# Patient Record
Sex: Male | Born: 1965 | Race: White | Hispanic: No | Marital: Married | Smoking: Never smoker
Health system: Southern US, Community
[De-identification: ages and names within clinical notes are randomized; demographics above are authoritative.]

## PROBLEM LIST (undated history)

## (undated) ENCOUNTER — Emergency Department: Payer: 59 | Source: Home / Self Care

## (undated) DIAGNOSIS — C8590 Non-Hodgkin lymphoma, unspecified, unspecified site: Secondary | ICD-10-CM

## (undated) HISTORY — DX: Non-Hodgkin lymphoma, unspecified, unspecified site: C85.90

---

## 1998-10-27 ENCOUNTER — Emergency Department (HOSPITAL_COMMUNITY): Admission: EM | Admit: 1998-10-27 | Discharge: 1998-10-27 | Payer: Self-pay

## 2008-04-16 ENCOUNTER — Inpatient Hospital Stay (HOSPITAL_COMMUNITY): Admission: EM | Admit: 2008-04-16 | Discharge: 2008-04-22 | Payer: Self-pay | Admitting: Emergency Medicine

## 2008-06-13 ENCOUNTER — Ambulatory Visit (HOSPITAL_COMMUNITY): Admission: RE | Admit: 2008-06-13 | Discharge: 2008-06-13 | Payer: Self-pay | Admitting: Family Medicine

## 2010-05-21 LAB — HEPATIC FUNCTION PANEL
ALT: 72 U/L — ABNORMAL HIGH (ref 0–53)
ALT: 77 U/L — ABNORMAL HIGH (ref 0–53)
AST: 119 U/L — ABNORMAL HIGH (ref 0–37)
AST: 121 U/L — ABNORMAL HIGH (ref 0–37)
Albumin: 2.8 g/dL — ABNORMAL LOW (ref 3.5–5.2)
Bilirubin, Direct: 0.1 mg/dL (ref 0.0–0.3)
Bilirubin, Direct: 0.2 mg/dL (ref 0.0–0.3)
Indirect Bilirubin: 0.3 mg/dL (ref 0.3–0.9)
Total Protein: 5.5 g/dL — ABNORMAL LOW (ref 6.0–8.3)

## 2010-05-21 LAB — CBC
HCT: 37.7 % — ABNORMAL LOW (ref 39.0–52.0)
HCT: 38.9 % — ABNORMAL LOW (ref 39.0–52.0)
HCT: 39.2 % (ref 39.0–52.0)
HCT: 42.6 % (ref 39.0–52.0)
Hemoglobin: 13.1 g/dL (ref 13.0–17.0)
Hemoglobin: 13.6 g/dL (ref 13.0–17.0)
MCHC: 34.8 g/dL (ref 30.0–36.0)
MCHC: 34.9 g/dL (ref 30.0–36.0)
MCHC: 35 g/dL (ref 30.0–36.0)
MCV: 89.3 fL (ref 78.0–100.0)
MCV: 89.4 fL (ref 78.0–100.0)
Platelets: 101 10*3/uL — ABNORMAL LOW (ref 150–400)
Platelets: 105 10*3/uL — ABNORMAL LOW (ref 150–400)
Platelets: 107 10*3/uL — ABNORMAL LOW (ref 150–400)
Platelets: 141 10*3/uL — ABNORMAL LOW (ref 150–400)
Platelets: 222 10*3/uL (ref 150–400)
Platelets: 90 10*3/uL — ABNORMAL LOW (ref 150–400)
RBC: 4.88 MIL/uL (ref 4.22–5.81)
RDW: 12.1 % (ref 11.5–15.5)
RDW: 12.3 % (ref 11.5–15.5)
RDW: 12.4 % (ref 11.5–15.5)
RDW: 12.7 % (ref 11.5–15.5)
WBC: 2.3 10*3/uL — ABNORMAL LOW (ref 4.0–10.5)
WBC: 2.6 10*3/uL — ABNORMAL LOW (ref 4.0–10.5)
WBC: 3.5 10*3/uL — ABNORMAL LOW (ref 4.0–10.5)
WBC: 7.1 10*3/uL (ref 4.0–10.5)

## 2010-05-21 LAB — LIPASE, BLOOD: Lipase: 74 U/L — ABNORMAL HIGH (ref 11–59)

## 2010-05-21 LAB — GLUCOSE, CAPILLARY
Glucose-Capillary: 102 mg/dL — ABNORMAL HIGH (ref 70–99)
Glucose-Capillary: 102 mg/dL — ABNORMAL HIGH (ref 70–99)
Glucose-Capillary: 105 mg/dL — ABNORMAL HIGH (ref 70–99)
Glucose-Capillary: 107 mg/dL — ABNORMAL HIGH (ref 70–99)
Glucose-Capillary: 114 mg/dL — ABNORMAL HIGH (ref 70–99)
Glucose-Capillary: 116 mg/dL — ABNORMAL HIGH (ref 70–99)
Glucose-Capillary: 83 mg/dL (ref 70–99)
Glucose-Capillary: 85 mg/dL (ref 70–99)
Glucose-Capillary: 89 mg/dL (ref 70–99)
Glucose-Capillary: 90 mg/dL (ref 70–99)
Glucose-Capillary: 96 mg/dL (ref 70–99)

## 2010-05-21 LAB — BASIC METABOLIC PANEL
BUN: 15 mg/dL (ref 6–23)
BUN: 6 mg/dL (ref 6–23)
BUN: 9 mg/dL (ref 6–23)
CO2: 30 mEq/L (ref 19–32)
Calcium: 7.7 mg/dL — ABNORMAL LOW (ref 8.4–10.5)
Calcium: 7.8 mg/dL — ABNORMAL LOW (ref 8.4–10.5)
Calcium: 7.8 mg/dL — ABNORMAL LOW (ref 8.4–10.5)
Calcium: 8.1 mg/dL — ABNORMAL LOW (ref 8.4–10.5)
Chloride: 102 mEq/L (ref 96–112)
Creatinine, Ser: 0.82 mg/dL (ref 0.4–1.5)
Creatinine, Ser: 0.85 mg/dL (ref 0.4–1.5)
Creatinine, Ser: 1 mg/dL (ref 0.4–1.5)
GFR calc Af Amer: >60 mL/min (ref >60)
GFR calc non Af Amer: >60 mL/min (ref >60)
GFR calc non Af Amer: >60 mL/min (ref >60)
GFR calc non Af Amer: >60 mL/min (ref >60)
GFR calc non Af Amer: >60 mL/min (ref >60)
GFR calc non Af Amer: >60 mL/min (ref >60)
Glucose, Bld: 105 mg/dL — ABNORMAL HIGH (ref 70–99)
Glucose, Bld: 91 mg/dL (ref 70–99)
Potassium: 3.6 mEq/L (ref 3.5–5.1)
Potassium: 4.3 mEq/L (ref 3.5–5.1)
Sodium: 127 mEq/L — ABNORMAL LOW (ref 135–145)
Sodium: 138 mEq/L (ref 135–145)

## 2010-05-21 LAB — DIFFERENTIAL
Basophils Absolute: 0 10*3/uL (ref 0.0–0.1)
Basophils Absolute: 0 10*3/uL (ref 0.0–0.1)
Basophils Absolute: 0 10*3/uL (ref 0.0–0.1)
Basophils Relative: 0 % (ref 0–1)
Basophils Relative: 0 % (ref 0–1)
Basophils Relative: 0 % (ref 0–1)
Eosinophils Absolute: 0 10*3/uL (ref 0.0–0.7)
Eosinophils Relative: 0 % (ref 0–5)
Eosinophils Relative: 0 % (ref 0–5)
Eosinophils Relative: 0 % (ref 0–5)
Lymphocytes Relative: 21 % (ref 12–46)
Lymphocytes Relative: 24 % (ref 12–46)
Lymphocytes Relative: 27 % (ref 12–46)
Lymphs Abs: 0.5 10*3/uL — ABNORMAL LOW (ref 0.7–4.0)
Lymphs Abs: 0.6 10*3/uL — ABNORMAL LOW (ref 0.7–4.0)
Lymphs Abs: 1 10*3/uL (ref 0.7–4.0)
Lymphs Abs: 1.1 10*3/uL (ref 0.7–4.0)
Lymphs Abs: 1.2 10*3/uL (ref 0.7–4.0)
Monocytes Absolute: 0.1 10*3/uL (ref 0.1–1.0)
Monocytes Absolute: 0.7 10*3/uL (ref 0.1–1.0)
Monocytes Absolute: 0.8 10*3/uL (ref 0.1–1.0)
Monocytes Relative: 2 % — ABNORMAL LOW (ref 3–12)
Monocytes Relative: 3 % (ref 3–12)
Monocytes Relative: 9 % (ref 3–12)
Neutro Abs: 1.6 10*3/uL — ABNORMAL LOW (ref 1.7–7.7)
Neutro Abs: 2 10*3/uL (ref 1.7–7.7)
Neutro Abs: 2.8 10*3/uL (ref 1.7–7.7)
Neutro Abs: 2.9 10*3/uL (ref 1.7–7.7)
Neutrophils Relative %: 57 % (ref 43–77)
Neutrophils Relative %: 70 % (ref 43–77)
Neutrophils Relative %: 71 % (ref 43–77)

## 2010-05-21 LAB — BLOOD GAS, ARTERIAL
Bicarbonate: 24.1 mEq/L — ABNORMAL HIGH (ref 20.0–24.0)
O2 Saturation: 93.1 %
Patient temperature: 37
TCO2: 21 mmol/L (ref 0–100)
pO2, Arterial: 66 mmHg — ABNORMAL LOW (ref 80.0–100.0)

## 2010-05-21 LAB — COMPREHENSIVE METABOLIC PANEL
Albumin: 2.4 g/dL — ABNORMAL LOW (ref 3.5–5.2)
Alkaline Phosphatase: 55 U/L (ref 39–117)
BUN: 13 mg/dL (ref 6–23)
Calcium: 7.6 mg/dL — ABNORMAL LOW (ref 8.4–10.5)
Creatinine, Ser: 1.19 mg/dL (ref 0.4–1.5)
Glucose, Bld: 115 mg/dL — ABNORMAL HIGH (ref 70–99)
Potassium: 3.9 mEq/L (ref 3.5–5.1)
Total Protein: 5.2 g/dL — ABNORMAL LOW (ref 6.0–8.3)

## 2010-05-21 LAB — PROTIME-INR
INR: 1.1 (ref 0.00–1.49)
Prothrombin Time: 14.3 seconds (ref 11.6–15.2)

## 2010-05-21 LAB — URINE MICROSCOPIC-ADD ON

## 2010-05-21 LAB — HEPATITIS PANEL, ACUTE: Hep A IgM: NEGATIVE

## 2010-05-21 LAB — URINALYSIS, ROUTINE W REFLEX MICROSCOPIC
Glucose, UA: NEGATIVE mg/dL
Leukocytes, UA: NEGATIVE
Nitrite: NEGATIVE
Specific Gravity, Urine: 1.025 (ref 1.005–1.030)
pH: 6 (ref 5.0–8.0)

## 2010-05-21 LAB — CARDIAC PANEL(CRET KIN+CKTOT+MB+TROPI)
CK, MB: 2.2 ng/mL (ref 0.3–4.0)
Total CK: 1192 U/L — ABNORMAL HIGH (ref 7–232)

## 2010-05-21 LAB — APTT: aPTT: 40 seconds — ABNORMAL HIGH (ref 24–37)

## 2010-05-21 LAB — HIV ANTIBODY (ROUTINE TESTING W REFLEX): HIV: NONREACTIVE

## 2010-05-21 LAB — LACTIC ACID, PLASMA: Lactic Acid, Venous: 1.4 mmol/L (ref 0.5–2.2)

## 2010-05-21 LAB — LEGIONELLA ANTIGEN, URINE: Legionella Antigen, Urine: NEGATIVE

## 2010-05-21 LAB — CK TOTAL AND CKMB (NOT AT ARMC)
CK, MB: 1.4 ng/mL (ref 0.3–4.0)
Relative Index: 0.1 (ref 0.0–2.5)

## 2010-05-21 LAB — HEMOGLOBIN A1C: Mean Plasma Glucose: 123 mg/dL

## 2010-05-21 LAB — CULTURE, BLOOD (ROUTINE X 2)
Culture: NO GROWTH
Report Status: 3142010

## 2010-05-21 LAB — STREP PNEUMONIAE URINARY ANTIGEN: Strep Pneumo Urinary Antigen: NEGATIVE

## 2010-06-23 NOTE — Group Therapy Note (Signed)
Kevin Good, Kevin Good                ACCOUNT NO.:  [id number]   MEDICAL RECORD NO.:  [id number]          PATIENT TYPE:  INP   LOCATION:  A306                          FACILITY:  [facility]   PHYSICIAN:  Dorris Singh, DO    DATE OF BIRTH:  [birth year]   DATE OF PROCEDURE:  04/20/2008  DATE OF DISCHARGE:                                 PROGRESS NOTE   The patient is seen today and states he feels a little bit better.  It  is a little easier to breathe but still having pain with inhalations.   PHYSICAL EXAMINATION:  VITAL SIGNS:  Temperature 98.5, pulse 104,  respirations 18, blood pressure 124/67.  GENERAL:  The patient is a 45 year old Caucasian male who is well-  developed, well-nourished, in no acute distress.  HEART:  Regular rate and rhythm.  LUNGS:  Positive crackles upper and bilaterally in both lung fields with  shallow respirations.  ABDOMEN:  Soft, nontender, nondistended.  EXTREMITIES:  Positive pulses.   Labs for today are as follows, white count of 4.9 which is an  improvement.  Hemoglobin 14.3, hematocrit 41.1, platelet count of 141.  Sodium 140, potassium 3.5, chloride 104, CO2 30, glucose 105, BUN 9,  creatinine 0.85.   ASSESSMENT AND PLAN:  1. Bilateral lung pneumonia, continue current antibiotic therapy.      Recommend incentive spirometry and O2.  2. Sepsis which has resolved.  3. Neutropenia.  This is actually resolving as well.  It is improving      from latest counts as well as his thrombocytopenia which is      improving.   We will continue to hydrate the patient and anticipate the possibility  of discharge home in the next 1-2 days as long as he improves.  I have  also recommended for the patient to be set up an appointment at  discharge with Dr. Mariel Sleet, the hematology/oncology doctor here.  He  has agreed.      Dorris Singh, DO  Electronically Signed     CB/MEDQ  D:  04/20/2008  T:  04/20/2008  Job:  [id number]

## 2010-06-23 NOTE — Discharge Summary (Signed)
[name], Good                ACCOUNT NO.:  [id number]   MEDICAL RECORD NO.:  [id number]          PATIENT TYPE:  INP   LOCATION:  A306                          FACILITY:  [facility]   PHYSICIAN:  Skeet Latch, DO    DATE OF BIRTH:  July 26, 1965   DATE OF ADMISSION:  04/16/2008  DATE OF DISCHARGE:  03/15/2010LH                               DISCHARGE SUMMARY   DISCHARGE DIAGNOSES:  1. Acute community-acquired pneumonia.  2. Dehydration.  3. History of diabetes.  4. History of hypertension.   BRIEF HOSPITAL COURSE:  This is a 45 year old Caucasian male who  presented with fever and productive cough.  The patient started to  develop chest pain with coughing and deep breathing which he states  sharp in nature located in retrosternal area of his chest.  The patient  experienced 103 temperature prior to being admitted and went to [facility]  ER.  He was diagnosed of pneumonia, sent home on erythromycin and cough  medication, did not feel better, went to the doctor's office and was  sent to emergency department at [facility].  Initial vitals showed  temperature of 99.3, blood pressure 118/75, heart rate on admission 120,  respiratory rate 24.  He is sating 91%.   Initial labs:  PH 7.433, PO2 is 66, PCO2 36.7, and bicarb 24.1.  Initial  chest x-ray showed bilateral lung infiltrates.  He did have CT angiogram  of his chest that showed no evidence of pulmonary embolism, showed  multifocal ground-glass and air space opacities that were most  consistent with infection, showed mild left hilar adenopathy with  increased number of small mediastinal lymph nodes.  Findings are most  likely reactive, did show a trace right-sided pleural effusion and fatty  infiltration of the liver.  The patient has been on IV antibiotics with  Levaquin and Zosyn.  The patient was also admitted and given round the  clock breathing treatments.  The patient has slightly improved, still  has some wheezing, but wants to  go home at this time.  I feel the  patient can go home if he has no fever and does not have elevated white  count.   DISCHARGE MEDICATIONS:  1. Levaquin 500 mg daily for 7 days.  2. Albuterol inhaler 2 puffs every 4-6 hours as needed.  3. Medrol Dosepak.   CONDITION ON DISCHARGE:  Stable.   DISPOSITION:  The patient will be discharged to home.   CURRENT VITALS:  Temperature is 97.1 and blood pressure 118/77.   LABORATORY DATA:  Sodium 139, potassium 3.3, chloride 105, CO2 is 27,  glucose 87, BUN 6, and creatinine 0.2.  White count 7.1, hemoglobin  13.0, hematocrit 37.5, platelet count 222,000.  Last chest x-ray showed  extensive right upper and lower lobe infiltrates compatible with  pneumonia.  No change from prior exam.   DISCHARGE PLANNING:  The patient is to maintain a low-fat, heart-healthy  diet.  He is to increase his activity slowly.  The patient is to take  all medications as prescribed.  We will give the patient contact  information regarding  Dr. Margo Aye; hopefully, he can follow up in the next  2-3 days.  The patient is to return to the emergency room if he has any  increasing shortness of breath or call 911 or his primary care  physician.  I believe the patient understands these instructions.      Skeet Latch, DO  Electronically Signed     SM/MEDQ  D:  04/22/2008  T:  04/23/2008  Job:  [id number]

## 2010-06-23 NOTE — H&P (Signed)
Kevin Good, Kevin Good                ACCOUNT NO.:  [id number]   MEDICAL RECORD NO.:  [id number]          PATIENT TYPE:  INP   LOCATION:  A306                          FACILITY:  [facility]   PHYSICIAN:  Osvaldo Shipper, MD     DATE OF BIRTH:  03-01-1965   DATE OF ADMISSION:  04/16/2008  DATE OF DISCHARGE:  LH                              HISTORY & PHYSICAL   PRIMARY CARE PHYSICIAN:  He does not have one.   ADMISSION DIAGNOSIS:  1. Acute community-acquired pneumonia.  2. Sepsis.  3. Dehydration.  4. Thrombocytopenia.  5. Leukopenia.   CHIEF COMPLAINT:  Cough, fever, chest pain since about 9 days.   HISTORY OF PRESENT ILLNESS:  The patient is a 45 year old Caucasian male  who was in his usual state of health until this past Tuesday, that is 9  days ago when he started feeling feverish, had a cough,  initially was  dry and then he started coughing up yellowish expectoration.  He started  developing chest pain especially with coughing and deep breathing which  was sharp in nature located in the retrosternal area.  And then he  experienced fever of 103 degrees Fahrenheit this past Sunday which  [facility]ed [facility].  He was diagnosed  with pneumonia and was sent home on erythromycin and a cough medication.  However, patient did not feel any better.  He became more and more short  of breath so he went to Dr. Tenna Delaine office today and he was referred  from there to the emergency department here at [facility].  He denies  any leg swelling.  Denies any sick contacts.  Denies any recent travel  anywhere.  No nausea, vomiting.  Denies ever having pneumonia in the  past.   MEDICATIONS AT HOME:  He is on erythromycin, cough syrup and Tylenol but  otherwise does not take any chronic medications.   ALLERGIES:  No known drug allergies.   PAST MEDICAL HISTORY:  Unremarkable for coronary artery disease,  diabetes, hypertension, pneumonia.  He has had an inguinal  hernia repair  in the past.   SOCIAL HISTORY:  Lives in [place] with his wife.  Quit smoking 15 years  ago.  No alcohol use.  No illicit drug use.  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  Denies any illicit relationships.   FAMILY HISTORY:  Mother is diabetic.   REVIEW OF SYSTEMS:  GENERAL:  Positive for weakness, malaise.  HEENT:  Unremarkable.  CARDIOVASCULAR:  Unremarkable.  RESPIRATORY:  As in HPI.  GI:  Unremarkable.  GU:  Unremarkable.  NEUROLOGIC:  Unremarkable.  Psychiatric unremarkable.  MUSCULOSKELETAL:  Unremarkable.  DERMATOLOGIC:  Unremarkable.  Other systems unremarkable.   PHYSICAL EXAMINATION:  VITAL SIGNS:  Temperature 99.3, blood pressure  118/65;  last reading is 140/80, heart rate was initially 120s regular,  respiratory rate 24, saturation 91%.  It is unclear if that was room air  or not but currently he is on 3 liters saturating about 93%, heart rate  has come down to  33s.  GENERAL:  An obese white male in no distress, sweating quite a bit.  HEENT: There is no pallor, no icterus.  Oral mucosal membranes are dry.  No oral lesions are noted.  NECK:  Soft and supple.  No thyromegaly is appreciated.  CARDIOVASCULAR:  S1 and S2 normal.  Regular.  No murmurs appreciated.  No S3 or S4.  No rubs.  No bruits.  LUNGS:  Reveal crackles bilateral bases.  Few rhonchi.  No wheezes are  present at this time.  ABDOMEN:  Obese, nontender, nondistended.  Bowel sounds are present.  No  masses or organomegaly is appreciated.  EXTREMITIES:  Show no edema.  Peripheral pulses are palpable.  MUSCULOSKELETAL:  Exam unremarkable.  NEUROLOGIC:  He is alert, oriented x3.  No focal neurological deficits  are present.  GU:  Examination was deferred.   Labs:  His ABG shows a pH of 7.43, pCO2 is  36, pO2 is 66.  This is on 3  liters and he is saturating 93% .  White count is 2.6 with  77%  neutrophils, hemoglobin 15.1, MCV 87, platelet count is 90,000.  Sodium  is  127, potassium is 3.6, chloride is 95, bicarb is 25,  glucose is 117,  BUN and creatinine are normal.  Calcium 7.7.  UA shows trace ketones,  large blood and some protein.   Chest x-ray shows bilateral lung infiltrates, looks like pretty much all  lobes are involved.   ASSESSMENT AND PLAN:  A 42-year white male who presents with cough,  fever, chest pain and has evidence for pneumonia and he has other acute  issues as well.  1. Acute community-acquired pneumonia.  Treat with Levaquin which will      cover atypicals as well.  Blood cultures will be sent.  I will also      check urine for Legionella and strep antigen.  Oxygen will be      provided.  He is hypoxic.  He is at high risk for respiratory      failure. He will be monitored closely on telemetry with frequent O2      saturation checks. Robitussin will be utilized as needed. Nebulizer      treatments will be utilized.  2. Abnormal urinalysis.  He could have rhabdomyolysis so we will go      ahead and check a total CK level.  3. Leukopenia and thrombocytopenia.  This could be a result of his      infection and sepsis.  Lactic acid level will be checked and these      levels will be followed very closely.  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  4. Dehydration with hyponatremia.  IV fluids will be provided.  LFTs      will be checked.  5. Deep venous thrombosis prophylaxis with sequential compressive      devices because of the thrombocytopenia.   Further  management decisions will depend on results of further testing  and patient's response to treatment.      Osvaldo Shipper, MD  Electronically Signed     GK/MEDQ  D:  04/16/2008  T:  04/16/2008  Job:  [id number]

## 2015-03-12 ENCOUNTER — Other Ambulatory Visit (HOSPITAL_COMMUNITY): Payer: Self-pay | Admitting: Family Medicine

## 2015-03-12 DIAGNOSIS — D489 Neoplasm of uncertain behavior, unspecified: Secondary | ICD-10-CM

## 2015-03-21 ENCOUNTER — Encounter (HOSPITAL_COMMUNITY)
Admission: RE | Admit: 2015-03-21 | Discharge: 2015-03-21 | Disposition: A | Discharge status: Home / Self Care | Payer: 59 | Source: Ambulatory Visit | Attending: Family Medicine | Admitting: Family Medicine

## 2015-03-21 ENCOUNTER — Other Ambulatory Visit (HOSPITAL_COMMUNITY): Payer: Self-pay | Admitting: Family Medicine

## 2015-03-21 DIAGNOSIS — D489 Neoplasm of uncertain behavior, unspecified: Secondary | ICD-10-CM | POA: Diagnosis present

## 2015-03-21 LAB — GLUCOSE, CAPILLARY: GLUCOSE-CAPILLARY: 126 mg/dL — AB (ref 65–99)

## 2015-03-21 MED ORDER — FLUDEOXYGLUCOSE F - 18 (FDG) INJECTION
11.6000 | Freq: Once | INTRAVENOUS | Status: AC | PRN
  Administered 2015-03-21: 11.6 via INTRAVENOUS

## 2015-07-24 DIAGNOSIS — C8258 Diffuse follicle center lymphoma, lymph nodes of multiple sites: Secondary | ICD-10-CM | POA: Insufficient documentation

## 2015-07-24 DIAGNOSIS — C8250 Diffuse follicle center lymphoma, unspecified site: Secondary | ICD-10-CM | POA: Insufficient documentation

## 2015-12-08 ENCOUNTER — Encounter (HOSPITAL_COMMUNITY): Payer: 59 | Attending: Oncology | Admitting: Oncology

## 2015-12-08 ENCOUNTER — Encounter (HOSPITAL_COMMUNITY): Payer: 59

## 2015-12-08 ENCOUNTER — Encounter (HOSPITAL_COMMUNITY): Payer: Self-pay | Admitting: Oncology

## 2015-12-08 VITALS — BP 155/105 | HR 71 | Temp 98.1°F | Resp 16 | Ht 70.0 in | Wt 221.0 lb

## 2015-12-08 DIAGNOSIS — Z0000 Encounter for general adult medical examination without abnormal findings: Secondary | ICD-10-CM

## 2015-12-08 DIAGNOSIS — C23 Malignant neoplasm of gallbladder: Secondary | ICD-10-CM | POA: Diagnosis not present

## 2015-12-08 DIAGNOSIS — C8293 Follicular lymphoma, unspecified, intra-abdominal lymph nodes: Secondary | ICD-10-CM | POA: Diagnosis present

## 2015-12-08 DIAGNOSIS — Z23 Encounter for immunization: Secondary | ICD-10-CM | POA: Diagnosis not present

## 2015-12-08 DIAGNOSIS — C8590 Non-Hodgkin lymphoma, unspecified, unspecified site: Secondary | ICD-10-CM | POA: Insufficient documentation

## 2015-12-08 HISTORY — DX: Non-Hodgkin lymphoma, unspecified, unspecified site: C85.90

## 2015-12-08 LAB — CBC WITH DIFFERENTIAL/PLATELET
BASOS PCT: 1 %
Basophils Absolute: 0 10*3/uL (ref 0.0–0.1)
Eosinophils Absolute: 0.3 10*3/uL (ref 0.0–0.7)
Eosinophils Relative: 3 %
HEMATOCRIT: 46.5 % (ref 39.0–52.0)
HEMOGLOBIN: 16.6 g/dL (ref 13.0–17.0)
LYMPHS ABS: 2.7 10*3/uL (ref 0.7–4.0)
Lymphocytes Relative: 32 %
MCH: 31.4 pg (ref 26.0–34.0)
MCHC: 35.7 g/dL (ref 30.0–36.0)
MCV: 88.1 fL (ref 78.0–100.0)
MONO ABS: 0.7 10*3/uL (ref 0.1–1.0)
Monocytes Relative: 8 %
NEUTROS ABS: 4.7 10*3/uL (ref 1.7–7.7)
NEUTROS PCT: 56 %
Platelets: 172 10*3/uL (ref 150–400)
RBC: 5.28 MIL/uL (ref 4.22–5.81)
RDW: 11.9 % (ref 11.5–15.5)
WBC: 8.4 10*3/uL (ref 4.0–10.5)

## 2015-12-08 LAB — COMPREHENSIVE METABOLIC PANEL
ALK PHOS: 122 U/L (ref 38–126)
ALT: 52 U/L (ref 17–63)
ANION GAP: 8 (ref 5–15)
AST: 26 U/L (ref 15–41)
Albumin: 4.5 g/dL (ref 3.5–5.0)
BILIRUBIN TOTAL: 0.5 mg/dL (ref 0.3–1.2)
BUN: 14 mg/dL (ref 6–20)
CALCIUM: 9 mg/dL (ref 8.9–10.3)
CO2: 27 mmol/L (ref 22–32)
Chloride: 100 mmol/L — ABNORMAL LOW (ref 101–111)
Creatinine, Ser: 0.91 mg/dL (ref 0.61–1.24)
Glucose, Bld: 329 mg/dL — ABNORMAL HIGH (ref 65–99)
POTASSIUM: 3.6 mmol/L (ref 3.5–5.1)
Sodium: 135 mmol/L (ref 135–145)
TOTAL PROTEIN: 7.3 g/dL (ref 6.5–8.1)

## 2015-12-08 LAB — SEDIMENTATION RATE: SED RATE: 2 mm/h (ref 0–16)

## 2015-12-08 LAB — C-REACTIVE PROTEIN: CRP: <0.8 mg/dL (ref <1.0)

## 2015-12-08 LAB — LACTATE DEHYDROGENASE: LDH: 134 U/L (ref 98–192)

## 2015-12-08 MED ORDER — PNEUMOCOCCAL 13-VAL CONJ VACC IM SUSP
0.5000 mL | Freq: Once | INTRAMUSCULAR | Status: AC
  Administered 2015-12-08: 0.5 mL via INTRAMUSCULAR

## 2015-12-08 MED ORDER — INFLUENZA VAC SPLIT QUAD 0.5 ML IM SUSY
PREFILLED_SYRINGE | INTRAMUSCULAR | Status: AC
  Filled 2015-12-08: qty 0.5

## 2015-12-08 MED ORDER — PNEUMOCOCCAL 13-VAL CONJ VACC IM SUSP
INTRAMUSCULAR | Status: AC
  Filled 2015-12-08: qty 0.5

## 2015-12-08 MED ORDER — INFLUENZA VAC SPLIT QUAD 0.5 ML IM SUSY
0.5000 mL | PREFILLED_SYRINGE | Freq: Once | INTRAMUSCULAR | Status: AC
  Administered 2015-12-08: 0.5 mL via INTRAMUSCULAR

## 2015-12-08 NOTE — Progress Notes (Signed)
Pt given Flu shot and Prevnar injection today. Pt tolerated well. Pt stable and discharged home with wife.

## 2015-12-08 NOTE — Assessment & Plan Note (Signed)
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  S/P mesenteric soft tissue biopsy by Dr. Cathey on 03/31/2015 and bone marrow aspiration and biopsy on 06/23/2015 (both performed at [facility]).  Oncology history developed.  Labs today: CBC diff, CMET, LDH, ESR, CRP.  I personally reviewed and went over laboratory results with the patient.  The results are noted within this dictation.  I personally reviewed and went over radiographic studies with the patient.  The results are noted within this dictation.  CT imaging from June 2017 reviewed.  I have called Rhonda in [place] pathology to request a second read on both biopsy of mesentery mass in addition to bone marrow aspiration and biopsy.  She is provided all the needed information.  Repeat imaging in December 2017 ordered: CT CAP with contrast.  Influenza immunization provided today.  Prevnar 13 immunization given today.  He can get PPSV23 in >/= 8 weeks from today.  PPSV23 order is signed and held.  Return in December 2017 to review imaging and set-up ongoing surveillance program. 

## 2015-12-08 NOTE — Progress Notes (Signed)
Review of [facility] Hematology/Oncology Consultation   Name: ANOOP HEMMER      MRN: [id number]     Date: 01/25/2016 Time:5:42 PM   REFERRING PHYSICIAN:  Tawni Carnes, PA-C (Primary care provider).  REASON FOR CONSULT:  NHL   DIAGNOSIS:  NHL, B-cell- Follicular lymphoma.    Non-Hodgkin's lymphoma ([place])   03/03/2015 Imaging    CT abd/pelvis- tiny periumbilical abdominal hernia containing fat, 5 cm irregular soft tissue mass in central small bowel mesentery, with mild adjacent lymphadenopathy.       03/21/2015 PET scan    1. Hypermetabolic central mesenteric mass. Hypermetabolic bilateral neck, bilateral axillary and bilateral pelvic lymphadenopathy. Findings are most suggestive of lymphoma. Tissue sampling is recommended. The most accessible pathologic targets are probably the axillary or inguinal nodes. 2. Additional findings include diffuse hepatic steatosis and small fat containing supraumbilical hernia.      04/01/2015 Procedure    Laparoscopic hernia repair with incidentally found mesenteric mass that was biopsied- Dr. Ladona Horns      04/01/2015 Pathology Results    NHL, B-cell type, follicular cell.      04/09/2015 Miscellaneous    Consultation with Dr. Jacquiline Doe at [facility]      06/23/2015 Bone Marrow Biopsy    Bone marrow aspiration and biopsy      07/21/2015 Imaging    CT CAP at [facility]- minimal decrease in size of abdominal mesenteric nodal mass and pelvic sidewall adenopathy.  Similar sized axillary nodes corresponding to hypermetabolism on prior PET.  No new sites of disease identified.  L5 pars defects.       HISTORY OF PRESENT ILLNESS:   Kevin Good is a 50 y.o. male with a medical history significant for DM, HTN, and seasonal allergies who is referred to the [facility] for transfer of hematology care for [place].  He reported abdominal pain earlier this year.  His abdominal pain was  thought to be secondary to abdominal hernia.  CT imaging was performed to evaluate this pain and a 5 cm mesenteric mass was noted, along with adenopathy.  He was otherwise asymptomatic.  He did undergo hernia repair by Dr. Ladona Horns in Feb 2017 with a biopsy of mesenteric mass.  Pathology demonstrated NHL.  He did undergo bone marrow aspiration and biopsy in May 2017.  Due to low disease burden and improvement in his abdominal pain after his hernia repair no additional therapy was pursued. He is asymptomatic. He notes that he feels well. He saw Dr. Jacquiline Doe in consultation at [facility], last visit was in June.  He denies any B symptoms.  His weight is stable.  His appetite is strong.  He denies any new lumps/bumps.  He denies. He continues to work full time, 5 days per week.  He denies any severe fatigue.    Review of Systems  Constitutional: Negative for chills, fever and weight loss.  HENT: Negative.   Eyes: Negative.   Respiratory: Negative.  Negative for cough and shortness of breath.   Cardiovascular: Negative.  Negative for chest pain.  Gastrointestinal: Negative.  Negative for abdominal pain, constipation, diarrhea, nausea and vomiting.  Genitourinary: Negative.   Musculoskeletal: Negative.   Skin: Negative.  Negative for rash.  Neurological: Negative.  Negative for weakness.  Endo/Heme/Allergies: Negative.   Psychiatric/Behavioral: Negative.   14 point review of systems was performed and is negative except as detailed under history of present  illness and above   PAST MEDICAL HISTORY:   Past Medical History:  Diagnosis Date  . Non-Hodgkin lymphoma ([place]) 12/08/2015    ALLERGIES: Not on File    MEDICATIONS: I have reviewed the patient's current medications.    No current outpatient prescriptions on file prior to visit.   No current facility-administered medications on file prior to visit.      PAST SURGICAL HISTORY History reviewed. No pertinent surgical history.  Inguinal  hernia repair Left 1194  Umbilical hernia repair 17/40/8144   FAMILY HISTORY: Mother is alive at the age of 35 with poorly controlled DM Father is alive at the age of 55 with heart disease 1 son age 17 who is healthy.  SOCIAL HISTORY:  reports that he has never smoked. He has never used smokeless tobacco. He reports that he does not drink alcohol or use drugs.  He is [place] in religion.  Married x 20 years.  He works for [employer] as a Administrator, Museum/gallery exhibitions officer, delivering fuel to stations.  Social History   Social History  . Marital status: Married    Spouse name: N/A  . Number of children: N/A  . Years of education: N/A   Social History Main Topics  . Smoking status: Never Smoker  . Smokeless tobacco: Never Used  . Alcohol use No  . Drug use: No  . Sexual activity: Not Asked   Other Topics Concern  . None   Social History Narrative  . None    PERFORMANCE STATUS: The patient's performance status is 0 - Asymptomatic  PHYSICAL EXAM: Most Recent Vital Signs: Blood pressure (!) 155/105, pulse 71, temperature 98.1 F (36.7 C), temperature source Oral, resp. rate 16, height '5\' 10"'  (1.778 m), weight 221 lb (100.2 kg), SpO2 96 %. General appearance: alert, cooperative, appears stated age, no distress, mildly obese and accompanied by wife "Missy". Head: Normocephalic, without obvious abnormality, atraumatic Eyes: negative findings: lids and lashes normal, conjunctivae and sclerae normal and corneas clear Throat: lips, mucosa, and tongue normal; teeth and gums normal Neck: no adenopathy and supple, symmetrical, trachea midline Back: symmetric, no curvature. ROM normal. No CVA tenderness. Lungs: clear to auscultation bilaterally and normal percussion bilaterally Heart: regular rate and rhythm, S1, S2 normal, no murmur, click, rub or gallop Abdomen: soft, non-tender; bowel sounds normal; no masses,  no organomegaly Extremities: extremities normal, atraumatic, no cyanosis or  edema Skin: Skin color, texture, turgor normal. No rashes or lesions Lymph nodes: Inguinal adenopathy: shotty bilateral inguinal nodes on exam Neurologic: Grossly normal  LABORATORY DATA:  CBC    Component Value Date/Time   WBC 8.4 12/08/2015 1333   RBC 5.28 12/08/2015 1333   HGB 16.6 12/08/2015 1333   HCT 46.5 12/08/2015 1333   PLT 172 12/08/2015 1333   MCV 88.1 12/08/2015 1333   MCH 31.4 12/08/2015 1333   MCHC 35.7 12/08/2015 1333   RDW 11.9 12/08/2015 1333   LYMPHSABS 2.7 12/08/2015 1333   MONOABS 0.7 12/08/2015 1333   EOSABS 0.3 12/08/2015 1333   BASOSABS 0.0 12/08/2015 1333     Chemistry      Component Value Date/Time   NA 135 12/08/2015 1333   K 3.6 12/08/2015 1333   CL 100 (L) 12/08/2015 1333   CO2 27 12/08/2015 1333   BUN 14 12/08/2015 1333   CREATININE 0.91 12/08/2015 1333      Component Value Date/Time   CALCIUM 9.0 12/08/2015 1333   ALKPHOS 122 12/08/2015 1333   AST 26 12/08/2015 1333  ALT 52 12/08/2015 1333   BILITOT 0.5 12/08/2015 1333     Lab Results  Component Value Date   ESRSEDRATE 2 12/08/2015     RADIOGRAPHY: No results found.     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  S/P mesenteric soft tissue biopsy by Dr. Ladona Horns on 03/31/2015 and bone marrow aspiration and biopsy on 06/23/2015 (both performed at [facility]).  Oncology history developed.  Labs today: CBC diff, CMET, LDH, ESR, CRP.  I personally reviewed and went over laboratory results with the patient.  The results are noted within this dictation.  I personally reviewed and went over radiographic studies with the patient.  The results are noted within this dictation.  CT imaging from June 2017 reviewed.  I have called Rhonda in [place] pathology to request a second read on both biopsy of mesentery mass in addition to bone marrow aspiration and biopsy.  She is provided all  the needed information. Will request copies of his BMBX for our records.   Repeat imaging in December 2017 ordered: CT CAP with contrast. If lymphoma is low grade and patient asymptomatic will review NCCN guidelines at follow-up with recommendations for ongoing surveillance with the patient in detail.   Influenza immunization provided today.  Prevnar 13 immunization given today.  He can get PPSV23 in >/= 8 weeks from today.  PPSV23 order is signed and held.  Return in December 2017 to review imaging and set-up ongoing surveillance program.  ORDERS PLACED FOR THIS ENCOUNTER: Orders Placed This Encounter  Procedures  . CT Abdomen Pelvis W Contrast  . CT Chest W Contrast  . CBC with Differential  . Comprehensive metabolic panel  . Lactate dehydrogenase  . Sedimentation rate  . C-reactive protein    MEDICATIONS PRESCRIBED THIS ENCOUNTER: Meds ordered this encounter  Medications  . lisinopril-hydrochlorothiazide (PRINZIDE,ZESTORETIC) 20-12.5 MG tablet  . metFORMIN (GLUCOPHAGE-XR) 500 MG 24 hr tablet  . pneumococcal 13-valent conjugate vaccine (PREVNAR 13) injection 0.5 mL  . Influenza vac split quadrivalent PF (FLUARIX) injection 0.5 mL    All questions were answered. The patient knows to call the clinic with any problems, questions or concerns. We can certainly see the patient much sooner if necessary.  This note is electronically signed by: Molli Hazard, MD 01/25/2016 5:42 PM

## 2015-12-08 NOTE — Patient Instructions (Signed)
[place] at [facility] Discharge Instructions  RECOMMENDATIONS MADE BY THE CONSULTANT AND ANY TEST RESULTS WILL BE SENT TO YOUR REFERRING PHYSICIAN.  You saw Kirby Crigler, PA-C, today. You had the flu Vaccine and Pneumovax today. You will need a 2nd pneumonia shot in 2 months. Lab work today. CT scan in December. Follow up in December after scans.  Thank you for choosing [place] at [facility] to provide your oncology and hematology care.  To afford each patient quality time with our provider, please arrive at least 15 minutes before your scheduled appointment time.   Beginning January 23rd 2017 lab work for the [facility] will be done in the  Main lab at [facility] on 1st floor. If you have a lab appointment with the [facility] please come in thru the  Main Entrance and check in at the main information desk  You need to re-schedule your appointment should you arrive 10 or more minutes late.  We strive to give you quality time with our providers, and arriving late affects you and other patients whose appointments are after yours.  Also, if you no show three or more times for appointments you may be dismissed from the clinic at the providers discretion.     Again, thank you for choosing [facility].  Our hope is that these requests will decrease the amount of time that you wait before being seen by our physicians.       _____________________________________________________________  Should you have questions after your visit to [facility], please contact our office at [phone] between the hours of 8:30 a.m. and 4:30 p.m.  Voicemails left after 4:30 p.m. will not be returned until the following business day.  For prescription refill requests, have your pharmacy contact our office.         Resources For Cancer Patients and their Caregivers ? American Cancer Society: Can assist with transportation,  wigs, general needs, runs Look Good Feel Better.        ([phone] ? Cancer Care: Provides financial assistance, online support groups, medication/co-pay assistance.  1-800-813-HOPE 3303079838) ? [place] Assists [place] cancer patients and their families through emotional , educational and financial support.  ([phone] [phone] ? [place] DSS Where to apply for food stamps, Medicaid and utility assistance. (515)152-7247 ? RCATS: Transportation to medical appointments. 210-525-8404 ? Social Security Administration: May apply for disability if have a Stage IV cancer. 951-468-3903 321-490-7032 ? [employer], Disability and Transit Services: Assists with nutrition, care and transit needs. [place] Support Programs: @10RELATIVEDAYS @ > Cancer Support Group  2nd Tuesday of the month 1pm-2pm, Journey Room  > Creative Journey  3rd Tuesday of the month 1130am-1pm, Journey Room  > Look Good Feel Better  1st Wednesday of the month 10am-12 noon, Journey Room (Call [place] to register [phone])

## 2015-12-11 ENCOUNTER — Telehealth (HOSPITAL_COMMUNITY): Payer: Self-pay | Admitting: *Deleted

## 2015-12-17 ENCOUNTER — Other Ambulatory Visit (HOSPITAL_COMMUNITY): Payer: Self-pay | Admitting: Oncology

## 2016-01-25 ENCOUNTER — Encounter (HOSPITAL_COMMUNITY): Payer: Self-pay | Admitting: Oncology

## 2016-01-26 ENCOUNTER — Ambulatory Visit (HOSPITAL_COMMUNITY)
Admission: RE | Admit: 2016-01-26 | Discharge: 2016-01-26 | Disposition: A | Discharge status: Home / Self Care | Payer: 59 | Source: Ambulatory Visit | Attending: Oncology | Admitting: Oncology

## 2016-01-26 DIAGNOSIS — C8293 Follicular lymphoma, unspecified, intra-abdominal lymph nodes: Secondary | ICD-10-CM | POA: Diagnosis not present

## 2016-01-26 DIAGNOSIS — I700 Atherosclerosis of aorta: Secondary | ICD-10-CM | POA: Insufficient documentation

## 2016-01-26 DIAGNOSIS — K760 Fatty (change of) liver, not elsewhere classified: Secondary | ICD-10-CM | POA: Diagnosis not present

## 2016-01-26 DIAGNOSIS — K439 Ventral hernia without obstruction or gangrene: Secondary | ICD-10-CM | POA: Diagnosis not present

## 2016-01-26 DIAGNOSIS — R160 Hepatomegaly, not elsewhere classified: Secondary | ICD-10-CM | POA: Insufficient documentation

## 2016-01-26 DIAGNOSIS — R590 Localized enlarged lymph nodes: Secondary | ICD-10-CM | POA: Insufficient documentation

## 2016-01-26 LAB — POCT I-STAT CREATININE: CREATININE: 0.9 mg/dL (ref 0.61–1.24)

## 2016-01-26 MED ORDER — IOPAMIDOL (ISOVUE-300) INJECTION 61%
100.0000 mL | Freq: Once | INTRAVENOUS | Status: AC | PRN
  Administered 2016-01-26: 100 mL via INTRAVENOUS

## 2016-01-28 ENCOUNTER — Ambulatory Visit (HOSPITAL_COMMUNITY): Payer: 59 | Admitting: Hematology & Oncology

## 2016-02-05 ENCOUNTER — Ambulatory Visit (HOSPITAL_COMMUNITY): Payer: 59

## 2016-02-16 ENCOUNTER — Encounter (HOSPITAL_BASED_OUTPATIENT_CLINIC_OR_DEPARTMENT_OTHER): Payer: 59

## 2016-02-16 ENCOUNTER — Encounter (HOSPITAL_COMMUNITY): Payer: Self-pay | Admitting: Hematology & Oncology

## 2016-02-16 ENCOUNTER — Encounter (HOSPITAL_COMMUNITY): Payer: 59 | Attending: Oncology | Admitting: Hematology & Oncology

## 2016-02-16 VITALS — BP 140/96 | HR 64 | Temp 97.7°F | Resp 20 | Wt 220.4 lb

## 2016-02-16 DIAGNOSIS — C8293 Follicular lymphoma, unspecified, intra-abdominal lymph nodes: Secondary | ICD-10-CM | POA: Diagnosis not present

## 2016-02-16 DIAGNOSIS — Z23 Encounter for immunization: Secondary | ICD-10-CM | POA: Diagnosis not present

## 2016-02-16 DIAGNOSIS — K760 Fatty (change of) liver, not elsewhere classified: Secondary | ICD-10-CM | POA: Diagnosis not present

## 2016-02-16 DIAGNOSIS — C8258 Diffuse follicle center lymphoma, lymph nodes of multiple sites: Secondary | ICD-10-CM

## 2016-02-16 DIAGNOSIS — Z0000 Encounter for general adult medical examination without abnormal findings: Secondary | ICD-10-CM

## 2016-02-16 MED ORDER — PNEUMOCOCCAL VAC POLYVALENT 25 MCG/0.5ML IJ INJ
0.5000 mL | INJECTION | Freq: Once | INTRAMUSCULAR | Status: AC
  Administered 2016-02-16: 0.5 mL via INTRAMUSCULAR
  Filled 2016-02-16: qty 0.5

## 2016-02-16 NOTE — Progress Notes (Signed)
Review of Systems  Constitutional: Positive for malaise/fatigue.  HENT: Negative.   Eyes: Negative.   Respiratory: Negative.   Cardiovascular: Negative.   Gastrointestinal: Negative.  Negative for abdominal pain.  Genitourinary: Negative.   Musculoskeletal: Negative.   Skin: Negative.   Neurological: Negative.   Endo/Heme/Allergies: Negative.   Psychiatric/Behavioral: Negative.   All other systems reviewed and are negative.          [facility] Hematology/Oncology Progress Note  Name: Kevin Good      MRN: [id number]     Date: 02/22/2016 Time:4:49 PM   REFERRING PHYSICIAN:  Tawni Carnes, PA-C (Primary care provider).  REASON FOR CONSULT:  NHL   DIAGNOSIS:  NHL, B-cell- Follicular lymphoma.    Non-Hodgkin's lymphoma ([place])   03/03/2015 Imaging    CT abd/pelvis- tiny periumbilical abdominal hernia containing fat, 5 cm irregular soft tissue mass in central small bowel mesentery, with mild adjacent lymphadenopathy.       03/21/2015 PET scan    1. Hypermetabolic central mesenteric mass. Hypermetabolic bilateral neck, bilateral axillary and bilateral pelvic lymphadenopathy. Findings are most suggestive of lymphoma. Tissue sampling is recommended. The most accessible pathologic targets are probably the axillary or inguinal nodes. 2. Additional findings include diffuse hepatic steatosis and small fat containing supraumbilical hernia.      04/01/2015 Procedure    Laparoscopic hernia repair with incidentally found mesenteric mass that was biopsied- Dr. Ladona Horns      04/01/2015 Pathology Results    NHL, B-cell type, follicular cell.      04/09/2015 Miscellaneous    Consultation with Dr. Jacquiline Doe at [facility]      06/23/2015 Bone Marrow Biopsy    Bone marrow aspiration and biopsy      07/21/2015 Imaging    CT CAP at [facility]- minimal decrease in size of abdominal mesenteric nodal mass and pelvic sidewall adenopathy.  Similar sized axillary nodes corresponding  to hypermetabolism on prior PET.  No new sites of disease identified.  L5 pars defects.      01/26/2016 Imaging    CT CAP- 1. Similar to slight response to therapy of abdominopelvic adenopathy. Although a dominant mesenteric mass measures smaller in transverse dimension today, is larger in greatest craniocaudal dimension. This suggests stability with measurement differences secondary to obliquity. A left external iliac node is slightly decreased in size while a right external iliac node is felt to be similar. 2. No new sites of disease identified. 3. Hepatic steatosis and hepatomegaly. 4. Aortic atherosclerosis. 5. Increased size of a fat containing ventral abdominal wall hernia. No residual edema to suggest strangulation.       HISTORY OF PRESENT ILLNESS:   Kevin Good is a 51 y.o. male with a medical history significant for DM, HTN, and seasonal allergies who is referred to the [facility] for transfer of hematology care for [place]. He has never undergone treatment for this.   He reported abdominal pain in early 2017.  His abdominal pain was thought to be secondary to abdominal hernia.  CT imaging was performed to evaluate this pain and a 5 cm mesenteric mass was noted, along with adenopathy.  He was otherwise asymptomatic.  He did undergo hernia repair by Dr. Ladona Horns in Feb 2017 with a biopsy of mesenteric mass.  Pathology demonstrated NHL.  He did undergo bone marrow aspiration and biopsy in May 2017.  He presents to the cancer center today unaccompanied. I have reviewed the scans with the patient.  He has never seen GI for his fatty liver, noted on imaging. 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 He notes that he has been making an active effort to improve his health.   His BP medication makes him drowsy occasionally. He takes it daily  Denies abdominal pain or any other complaints today.  No B symptoms. He notes that he feels well   Review of Systems  Constitutional: Positive for malaise/fatigue.  HENT: Negative.   Eyes: Negative.   Respiratory: Negative.   Cardiovascular: Negative.   Gastrointestinal: Negative.  Negative for abdominal pain.  Genitourinary: Negative.   Musculoskeletal: Negative.   Skin: Negative.   Neurological: Negative.   Endo/Heme/Allergies: Negative.   Psychiatric/Behavioral: Negative.   All other systems reviewed and are negative.  14 point review of systems was performed and is negative except as detailed under history of present illness and above   PAST MEDICAL HISTORY:   Past Medical History:  Diagnosis Date  . Non-Hodgkin lymphoma ([place]) 12/08/2015    ALLERGIES: Not on File    MEDICATIONS: I have reviewed the patient's current medications.    Current Outpatient Prescriptions on File Prior to Visit  Medication Sig Dispense Refill  . lisinopril-hydrochlorothiazide (PRINZIDE,ZESTORETIC) 20-12.5 MG tablet     . metFORMIN (GLUCOPHAGE-XR) 500 MG 24 hr tablet      No current facility-administered medications on file prior to visit.      PAST SURGICAL HISTORY History reviewed. No pertinent surgical history.  Inguinal hernia repair Left 5465  Umbilical hernia repair 03/54/6568   FAMILY HISTORY: Mother is alive at the age of 74 with poorly controlled DM Father is alive at the age of 63 with heart disease 1 son age 48 who is healthy.  SOCIAL HISTORY:  reports that he has never smoked. He has never used smokeless tobacco. He reports that he does not drink alcohol or use drugs.  He is [place] in religion.  Married x 20 years.  He works for [employer] as a Administrator, Museum/gallery exhibitions officer, delivering fuel to stations.  Social History   Social History  . Marital status: Married    Spouse name: N/A  . Number of children: N/A  . Years of education: N/A   Social History Main Topics  . Smoking status: Never Smoker  . Smokeless tobacco: Never  Used  . Alcohol use No  . Drug use: No  . Sexual activity: Not Asked   Other Topics Concern  . None   Social History Narrative  . None    PERFORMANCE STATUS: The patient's performance status is 0 - Asymptomatic  PHYSICAL EXAM: Most Recent Vital Signs: Blood pressure (!) 140/96, pulse 64, temperature 97.7 F (36.5 C), temperature source Oral, resp. rate 20, weight 220 lb 6.4 oz (100 kg), SpO2 97 %.   Physical Exam  Constitutional: He is oriented to person, place, and time and well-developed, well-nourished, and in no distress.  Pt was able to get on exam table without assistance.   HENT:  Head: Normocephalic and atraumatic.  Mouth/Throat: Oropharynx is clear and moist.  Eyes: Conjunctivae and EOM are normal. Pupils are equal, round, and reactive to light. No scleral icterus.  Neck: Normal range of motion. Neck supple.  Cardiovascular: Normal rate, regular rhythm and normal heart sounds.   Pulmonary/Chest: Effort normal and breath sounds normal.  Abdominal: Soft. Bowel sounds are normal. He exhibits no distension and no mass. There is no tenderness.  There is no rebound and no guarding.  Musculoskeletal: Normal range of motion.  Lymphadenopathy:    He has no cervical adenopathy.  Neurological: He is alert and oriented to person, place, and time. Gait normal.  Skin: Skin is warm and dry.  Psychiatric: Mood, memory, affect and judgment normal.  Nursing note and vitals reviewed.   LABORATORY DATA:  CBC    Component Value Date/Time   WBC 8.4 12/08/2015 1333   RBC 5.28 12/08/2015 1333   HGB 16.6 12/08/2015 1333   HCT 46.5 12/08/2015 1333   PLT 172 12/08/2015 1333   MCV 88.1 12/08/2015 1333   MCH 31.4 12/08/2015 1333   MCHC 35.7 12/08/2015 1333   RDW 11.9 12/08/2015 1333   LYMPHSABS 2.7 12/08/2015 1333   MONOABS 0.7 12/08/2015 1333   EOSABS 0.3 12/08/2015 1333   BASOSABS 0.0 12/08/2015 1333     Chemistry      Component Value Date/Time   NA 135 12/08/2015 1333   K  3.6 12/08/2015 1333   CL 100 (L) 12/08/2015 1333   CO2 27 12/08/2015 1333   BUN 14 12/08/2015 1333   CREATININE 0.90 01/26/2016 1035      Component Value Date/Time   CALCIUM 9.0 12/08/2015 1333   ALKPHOS 122 12/08/2015 1333   AST 26 12/08/2015 1333   ALT 52 12/08/2015 1333   BILITOT 0.5 12/08/2015 1333     Lab Results  Component Value Date   ESRSEDRATE 2 12/08/2015     RADIOGRAPHY: I have reviewed the below radiographic studies and agree with their findings.   Study Result   CLINICAL DATA:  Restaging of follicular lymphoma diagnosed 18 months ago. Patient denies treatments. Diabetes. Hernia surgery.  EXAM: CT CHEST, ABDOMEN, AND PELVIS WITH CONTRAST  TECHNIQUE: Multidetector CT imaging of the chest, abdomen and pelvis was performed following the standard protocol during bolus administration of intravenous contrast.  CONTRAST:  165m ISOVUE-300 IOPAMIDOL (ISOVUE-300) INJECTION 61%  COMPARISON:  07/21/2015 PET of 03/21/2015.  FINDINGS: CT CHEST FINDINGS  Cardiovascular: Ascending aortic dilatation. This measures on the order of 4.2 cm on image 27/series 2. Similar to on the prior exam (when remeasured). Tortuous thoracic aorta. Borderline cardiomegaly. No pericardial effusion. No central pulmonary embolism, on this non-dedicated study.  Mediastinum/Nodes: No supraclavicular adenopathy. Index right axillary node measures 1.6 x 0.8 cm on image 19/series 2. This is unchanged. No axillary adenopathy. No mediastinal or hilar adenopathy.  Lungs/Pleura: No pleural fluid.  Clear lungs.  Musculoskeletal: No acute osseous abnormality.  CT ABDOMEN PELVIS FINDINGS  Hepatobiliary: Suspicion of mild hepatic steatosis. Sparing adjacent the gallbladder. No suspicious liver lesion. Hepatomegaly at 19.1 cm craniocaudal. Normal gallbladder, without biliary ductal dilatation.  Pancreas: Normal, without mass or ductal dilatation.  Spleen: Normal in size,  without focal abnormality.  Adrenals/Urinary Tract: Normal adrenal glands. Normal kidneys, without hydronephrosis. Normal urinary bladder.  Stomach/Bowel: Normal stomach, without wall thickening. Scattered colonic diverticula. Normal terminal ileum and appendix. Normal small bowel.  Vascular/Lymphatic: Aortic atherosclerosis. No retroperitoneal or retrocrural adenopathy. Again identified is jejunal mesenteric adenopathy. Index partially calcified nodal mass measures 3.5 x 2.1 cm today versus 4.0 by 2.3 cm on the prior. 5.0 cm craniocaudal on sagittal image 108 today versus 4.3 cm on the prior exam (when remeasured). More cephalad mesenteric node measures 10 mm on image 79/series 2 versus 12 mm on the prior exam (when remeasured).  Right external iliac node measures 1.3 x 3.3 cm on image 113/series 2. Compare 1.4 x 3.2 cm on the  prior.  Left external iliac node measures 1.6 cm on image 114/series 2 versus 1.9 cm on the prior exam (when remeasured).  Reproductive: Normal prostate.  Other: No significant free fluid. No evidence of omental or peritoneal disease. Central and paracentral ventral abdominal wall hernia contains fat and is enlarged since the prior. No edema within the herniated fat today.  Musculoskeletal: Bilateral L5 pars defects. Interval healing of lucency about the right side of the L5 vertebral body. Example image 90/series 2. Degenerative disc disease at the lumbosacral junction  IMPRESSION: 1. Similar to slight response to therapy of abdominopelvic adenopathy. Although a dominant mesenteric mass measures smaller in transverse dimension today, is larger in greatest craniocaudal dimension. This suggests stability with measurement differences secondary to obliquity. A left external iliac node is slightly decreased in size while a right external iliac node is felt to be similar. 2. No new sites of disease identified. 3. Hepatic steatosis and  hepatomegaly. 4.  Aortic atherosclerosis. 5. Increased size of a fat containing ventral abdominal wall hernia. No residual edema to suggest strangulation.   Electronically Signed   By: Abigail Miyamoto M.D.   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  S/P mesenteric soft tissue biopsy by Dr. Ladona Horns on 03/31/2015 and bone marrow aspiration and biopsy on 06/23/2015 (both performed at [facility]). CT imaging reviewed with the patient, results are noted above. No indications for treatment at this time.   PPSV vaccination given today.   I have recommended referral to GI for fatty liver. He is unsure if he would like to go to this appointment yet.   I have encouraged him to continue on his diet and start walking or exercising more.   He will return for a follow up in 6 months.   ORDERS PLACED FOR THIS ENCOUNTER: Orders Placed This Encounter  Procedures  . CBC with Differential  . Comprehensive metabolic panel  . Lactate dehydrogenase    MEDICATIONS PRESCRIBED THIS ENCOUNTER: Meds ordered this encounter  Medications  . cloNIDine (CATAPRES) 0.1 MG tablet    All questions were answered. The patient knows to call the clinic with any problems, questions or concerns. We can certainly see the patient much sooner if necessary.  This document serves as a record of services personally performed by Ancil Linsey, MD. It was created on her behalf by Martinique Casey, a trained medical scribe. The creation of this record is based on the scribe's personal observations and the provider's statements to them. This document has been checked and approved by the attending provider.  I have reviewed the above documentation for accuracy and completeness and I agree with the above.  This note is electronically signed by: Molli Hazard,  MD 02/22/2016 4:49 PM

## 2016-02-16 NOTE — Patient Instructions (Signed)
[place] at [facility] Discharge Instructions  RECOMMENDATIONS MADE BY THE CONSULTANT AND ANY TEST RESULTS WILL BE SENT TO YOUR REFERRING PHYSICIAN.  Received Pneumococcal vaccine today. Follow-up as scheduled. Call clinic for any questions or concerns  Thank you for choosing [place] at [facility] to provide your oncology and hematology care.  To afford each patient quality time with our provider, please arrive at least 15 minutes before your scheduled appointment time.    If you have a lab appointment with the [place] please come in thru the  Main Entrance and check in at the main information desk  You need to re-schedule your appointment should you arrive 10 or more minutes late.  We strive to give you quality time with our providers, and arriving late affects you and other patients whose appointments are after yours.  Also, if you no show three or more times for appointments you may be dismissed from the clinic at the providers discretion.     Again, thank you for choosing [facility].  Our hope is that these requests will decrease the amount of time that you wait before being seen by our physicians.       _____________________________________________________________  Should you have questions after your visit to [facility], please contact our office at [phone] between the hours of 8:30 a.m. and 4:30 p.m.  Voicemails left after 4:30 p.m. will not be returned until the following business day.  For prescription refill requests, have your pharmacy contact our office.       Resources For Cancer Patients and their Caregivers ? American Cancer Society: Can assist with transportation, wigs, general needs, runs Look Good Feel Better.        806-329-7454 ? Cancer Care: Provides financial assistance, online support groups, medication/co-pay assistance.  1-800-813-HOPE (559) 464-3325) ? [place] Assists [place] cancer patients and their families through emotional , educational and financial support.  (223)340-0965 ? [place] DSS Where to apply for food stamps, Medicaid and utility assistance. 254-124-3689 ? RCATS: Transportation to medical appointments. 202-503-1859 ? Social Security Administration: May apply for disability if have a Stage IV cancer. 570-603-0875 [phone])460-5964 ? [employer], Disability and Transit Services: Assists with nutrition, care and transit needs. [place] Support Programs: @10RELATIVEDAYS @ > Cancer Support Group  2nd Tuesday of the month 1pm-2pm, Journey Room  > Creative Journey  3rd Tuesday of the month 1130am-1pm, Journey Room  > Look Good Feel Better  1st Wednesday of the month 10am-12 noon, Journey Room (Call [place] to register [phone])

## 2016-02-16 NOTE — Patient Instructions (Addendum)
[place] at [facility] Discharge Instructions  RECOMMENDATIONS MADE BY THE CONSULTANT AND ANY TEST RESULTS WILL BE SENT TO YOUR REFERRING PHYSICIAN.  You were seen today by Dr. Youlanda Roys will get your pneumoccocal vaccine today Follow up in 6 months with lab work   Thank you for choosing [place] at [facility] to provide your oncology and hematology care.  To afford each patient quality time with our provider, please arrive at least 15 minutes before your scheduled appointment time.    If you have a lab appointment with the [place] please come in thru the  Main Entrance and check in at the main information desk  You need to re-schedule your appointment should you arrive 10 or more minutes late.  We strive to give you quality time with our providers, and arriving late affects you and other patients whose appointments are after yours.  Also, if you no show three or more times for appointments you may be dismissed from the clinic at the providers discretion.     Again, thank you for choosing [facility].  Our hope is that these requests will decrease the amount of time that you wait before being seen by our physicians.       _____________________________________________________________  Should you have questions after your visit to [facility], please contact our office at [phone] between the hours of 8:30 a.m. and 4:30 p.m.  Voicemails left after 4:30 p.m. will not be returned until the following business day.  For prescription refill requests, have your pharmacy contact our office.       Resources For Cancer Patients and their Caregivers ? American Cancer Society: Can assist with transportation, wigs, general needs, runs Look Good Feel Better.        ([phone] 775-7759 ? Cancer Care: Provides financial assistance, online support groups, medication/co-pay assistance.  1-800-813-HOPE (872) 832-6413) ? [place] Assists [place] cancer patients and their families through emotional , educational and financial support.  (570)746-1060 ? [place] DSS Where to apply for food stamps, Medicaid and utility assistance. 430-877-5678 ? RCATS: Transportation to medical appointments. 317-072-8475 ? Social Security Administration: May apply for disability if have a Stage IV cancer. 978-537-5822 (636)619-0082 ? [employer], Disability and Transit Services: Assists with nutrition, care and transit needs. [place] Support Programs: @10RELATIVEDAYS @ > Cancer Support Group  2nd Tuesday of the month 1pm-2pm, Journey Room  > Creative Journey  3rd Tuesday of the month 1130am-1pm, Journey Room  > Look Good Feel Better  1st Wednesday of the month 10am-12 noon, Journey Room (Call [place] to register [phone])

## 2016-02-16 NOTE — Progress Notes (Signed)
Kevin Good tolerated Pneumococcal vaccine well without complaints or incident. Pt discharged self ambulatory in satisfactory condition

## 2016-02-17 MED ORDER — OCTREOTIDE ACETATE 30 MG IM KIT
PACK | INTRAMUSCULAR | Status: AC
  Filled 2016-02-17: qty 1

## 2016-02-22 ENCOUNTER — Encounter (HOSPITAL_COMMUNITY): Payer: Self-pay | Admitting: Hematology & Oncology

## 2016-08-17 ENCOUNTER — Ambulatory Visit (HOSPITAL_COMMUNITY): Payer: 59

## 2016-08-17 ENCOUNTER — Other Ambulatory Visit (HOSPITAL_COMMUNITY): Payer: 59

## 2016-09-20 DIAGNOSIS — C8203 Follicular lymphoma grade I, intra-abdominal lymph nodes: Secondary | ICD-10-CM | POA: Diagnosis not present

## 2016-09-21 ENCOUNTER — Other Ambulatory Visit: Payer: Self-pay | Admitting: Oncology

## 2016-09-21 DIAGNOSIS — C8230 Follicular lymphoma grade IIIa, unspecified site: Secondary | ICD-10-CM

## 2016-12-27 DIAGNOSIS — Z23 Encounter for immunization: Secondary | ICD-10-CM | POA: Diagnosis not present

## 2017-03-07 DIAGNOSIS — Z0000 Encounter for general adult medical examination without abnormal findings: Secondary | ICD-10-CM | POA: Diagnosis not present

## 2017-03-07 DIAGNOSIS — E119 Type 2 diabetes mellitus without complications: Secondary | ICD-10-CM | POA: Diagnosis not present

## 2017-03-07 DIAGNOSIS — Z6832 Body mass index (BMI) 32.0-32.9, adult: Secondary | ICD-10-CM | POA: Diagnosis not present

## 2017-03-07 DIAGNOSIS — I10 Essential (primary) hypertension: Secondary | ICD-10-CM | POA: Diagnosis not present

## 2017-03-07 DIAGNOSIS — C8593 Non-Hodgkin lymphoma, unspecified, intra-abdominal lymph nodes: Secondary | ICD-10-CM | POA: Diagnosis not present

## 2017-03-11 ENCOUNTER — Encounter (HOSPITAL_COMMUNITY): Payer: Self-pay

## 2017-03-11 ENCOUNTER — Ambulatory Visit (HOSPITAL_COMMUNITY): Payer: 59

## 2017-03-14 ENCOUNTER — Encounter (HOSPITAL_COMMUNITY): Payer: Self-pay | Admitting: Oncology

## 2017-03-14 ENCOUNTER — Other Ambulatory Visit: Payer: Self-pay

## 2017-03-14 ENCOUNTER — Inpatient Hospital Stay (HOSPITAL_COMMUNITY): Payer: 59 | Attending: Oncology | Admitting: Oncology

## 2017-03-14 DIAGNOSIS — C8283 Other types of follicular lymphoma, intra-abdominal lymph nodes: Secondary | ICD-10-CM | POA: Diagnosis not present

## 2017-03-14 DIAGNOSIS — K760 Fatty (change of) liver, not elsewhere classified: Secondary | ICD-10-CM

## 2017-03-14 DIAGNOSIS — C8258 Diffuse follicle center lymphoma, lymph nodes of multiple sites: Secondary | ICD-10-CM

## 2017-03-14 DIAGNOSIS — I10 Essential (primary) hypertension: Secondary | ICD-10-CM | POA: Insufficient documentation

## 2017-03-14 DIAGNOSIS — E119 Type 2 diabetes mellitus without complications: Secondary | ICD-10-CM | POA: Diagnosis not present

## 2017-03-14 NOTE — Assessment & Plan Note (Deleted)
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  S/P mesenteric soft tissue biopsy by Dr. Ladona Horns on 03/31/2015 and bone marrow aspiration and biopsy on 06/23/2015 (both performed at [facility]).  Oncology history developed.  Labs today: CBC diff, CMET, LDH, ESR, CRP.  I personally reviewed and went over laboratory results with the patient.  The results are noted within this dictation.  I personally reviewed and went over radiographic studies with the patient.  The results are noted within this dictation.  CT imaging from June 2017 reviewed.  I have called Rhonda in [place] pathology to request a second read on both biopsy of mesentery mass in addition to bone marrow aspiration and biopsy.  She is provided all the needed information.  Repeat imaging in December 2017 ordered: CT CAP with contrast.  Influenza immunization provided today.  Prevnar 13 immunization given today.  He can get PPSV23 in >/= 8 weeks from today.  PPSV23 order is signed and held.  Return in December 2017 to review imaging and set-up ongoing surveillance program.

## 2017-03-14 NOTE — Patient Instructions (Signed)
[facility] at [facility] Discharge Instructions  RECOMMENDATIONS MADE BY THE CONSULTANT AND ANY TEST RESULTS WILL BE SENT TO YOUR REFERRING PHYSICIAN.  You were seen today by Jenny Burns, NP  Thank you for choosing [facility] at [facility] to provide your oncology and hematology care.  To afford each patient quality time with our provider, please arrive at least 15 minutes before your scheduled appointment time.    If you have a lab appointment with the Cancer Center please come in thru the  Main Entrance and check in at the main information desk  You need to re-schedule your appointment should you arrive 10 or more minutes late.  We strive to give you quality time with our providers, and arriving late affects you and other patients whose appointments are after yours.  Also, if you no show three or more times for appointments you may be dismissed from the clinic at the providers discretion.     Again, thank you for choosing [facility].  Our hope is that these requests will decrease the amount of time that you wait before being seen by our physicians.       _____________________________________________________________  Should you have questions after your visit to [facility], please contact our office at [phone] between the hours of 8:30 a.m. and 4:30 p.m.  Voicemails left after 4:30 p.m. will not be returned until the following business day.  For prescription refill requests, have your pharmacy contact our office.       Resources For Cancer Patients and their Caregivers ? American Cancer Society: Can assist with transportation, wigs, general needs, runs Look Good Feel Better.        1-888-227-6333 ? Cancer Care: Provides financial assistance, online support groups, medication/co-pay assistance.  1-800-813-HOPE (4673) ? [facility] Assists [place] cancer patients and their  families through emotional , educational and financial support.  336-427-4357 ? [place] DSS Where to apply for food stamps, Medicaid and utility assistance. 336-342-1394 ? [facility]: Transportation to medical appointments. 336-347-2287 ? Social Security Administration: May apply for disability if have a Stage IV cancer. 336-342-7796 1-800-772-1213 ? [place] Aging, Disability and Transit Services: Assists with nutrition, care and transit needs. 336-349-2343  Cancer Center Support Programs: @[email or web address]@ > Cancer Support Group  2nd Tuesday of the month 1pm-2pm, Journey Room  > Creative Journey  3rd Tuesday of the month 1130am-1pm, Journey Room  > Look Good Feel Better  1st Wednesday of the month 10am-12 noon, Journey Room (Call American Cancer Society to register 1-800-395-5775)    

## 2017-03-14 NOTE — Progress Notes (Signed)
Review of Systems  Constitutional: Negative.  Negative for malaise/fatigue.  HENT: Negative.   Eyes: Negative.   Respiratory: Negative.   Cardiovascular: Negative.   Gastrointestinal: Negative.  Negative for abdominal pain.  Genitourinary: Negative.   Musculoskeletal: Negative.   Skin: Negative.   Neurological: Negative.   Endo/Heme/Allergies: Negative.   Psychiatric/Behavioral: Negative.   All other systems reviewed and are negative.          [facility] Hematology/Oncology Progress Note  Name: Kevin Good      MRN: [id number]     Date: 03/22/2017 Time:10:56 AM   REFERRING PHYSICIAN:  Tawni Carnes, PA-C (Primary care provider).  REASON FOR CONSULT:  NHL   DIAGNOSIS:  NHL, B-cell- Follicular lymphoma.    Non-Hodgkin's lymphoma ([place])   03/03/2015 Imaging    CT abd/pelvis- tiny periumbilical abdominal hernia containing fat, 5 cm irregular soft tissue mass in central small bowel mesentery, with mild adjacent lymphadenopathy.       03/21/2015 PET scan    1. Hypermetabolic central mesenteric mass. Hypermetabolic bilateral neck, bilateral axillary and bilateral pelvic lymphadenopathy. Findings are most suggestive of lymphoma. Tissue sampling is recommended. The most accessible pathologic targets are probably the axillary or inguinal nodes. 2. Additional findings include diffuse hepatic steatosis and small fat containing supraumbilical hernia.      04/01/2015 Procedure    Laparoscopic hernia repair with incidentally found mesenteric mass that was biopsied- Dr. Ladona Horns      04/01/2015 Pathology Results    NHL, B-cell type, follicular cell.      04/09/2015 Miscellaneous    Consultation with Dr. Jacquiline Doe at [facility]      06/23/2015 Bone Marrow Biopsy    Bone marrow aspiration and biopsy      07/21/2015 Imaging    CT CAP at [facility]- minimal decrease in size of abdominal mesenteric nodal mass and pelvic sidewall adenopathy.  Similar sized axillary nodes  corresponding to hypermetabolism on prior PET.  No new sites of disease identified.  L5 pars defects.      01/26/2016 Imaging    CT CAP- 1. Similar to slight response to therapy of abdominopelvic adenopathy. Although a dominant mesenteric mass measures smaller in transverse dimension today, is larger in greatest craniocaudal dimension. This suggests stability with measurement differences secondary to obliquity. A left external iliac node is slightly decreased in size while a right external iliac node is felt to be similar. 2. No new sites of disease identified. 3. Hepatic steatosis and hepatomegaly. 4. Aortic atherosclerosis. 5. Increased size of a fat containing ventral abdominal wall hernia. No residual edema to suggest strangulation.       HISTORY OF PRESENT ILLNESS:   Kevin Good is a 52 y.o. male with a medical history significant for DM, HTN, and seasonal allergies who is referred to the [facility] for transfer of hematology care for [place]. He has never undergone treatment for this.   He reported abdominal pain in early 2017.  His abdominal pain was thought to be secondary to abdominal hernia.  CT imaging was performed to evaluate this pain and a 5 cm mesenteric mass was noted, along with adenopathy.  He was otherwise asymptomatic.  He did undergo hernia repair by Dr. Ladona Horns in Feb 2017 with a biopsy of mesenteric mass.  Pathology demonstrated NHL.  He did undergo bone marrow aspiration and biopsy in May 2017.  Patient presents to the cancer center today unaccompanied for his follow-up. Patient has not received any  scans or labs by the cancer center and was told to contact usif he developed any B symptoms or becomes concerned prior to his next visit. Today he states he has been feeling "well". He denies any intentional weight loss, night sweats or fatigue at this time.  He continues follow-up by gastroenterology for his fatty liver. He continues  to try to diet and exercise when able. He takes blood pressure medication around lunchtime daily. He attributes this to his elevated blood pressure today.  14 point review of systems was performed and is negative except as detailed under history of present illness and above   PAST MEDICAL HISTORY:   Past Medical History:  Diagnosis Date  . Non-Hodgkin lymphoma ([place]) 12/08/2015    ALLERGIES: Not on File    MEDICATIONS: I have reviewed the patient's current medications.    Current Outpatient Medications on File Prior to Visit  Medication Sig Dispense Refill  . cloNIDine (CATAPRES) 0.1 MG tablet     . lisinopril-hydrochlorothiazide (PRINZIDE,ZESTORETIC) 20-12.5 MG tablet     . metFORMIN (GLUCOPHAGE-XR) 500 MG 24 hr tablet      No current facility-administered medications on file prior to visit.      PAST SURGICAL HISTORY History reviewed. No pertinent surgical history.  Inguinal hernia repair Left 6659  Umbilical hernia repair 93/57/0177   FAMILY HISTORY: Mother is alive at the age of 21 with poorly controlled DM Father is alive at the age of 57 with heart disease 1 son age 91 who is healthy.  SOCIAL HISTORY:  reports that  has never smoked. he has never used smokeless tobacco. He reports that he does not drink alcohol or use drugs.  He is [place] in religion.  Married x 20 years.  He works for [employer] as a Administrator, Museum/gallery exhibitions officer, delivering fuel to stations.  Social History   Socioeconomic History  . Marital status: Married    Spouse name: None  . Number of children: None  . Years of education: None  . Highest education level: None  Social Needs  . Financial resource strain: None  . Food insecurity - worry: None  . Food insecurity - inability: None  . Transportation needs - medical: None  . Transportation needs - non-medical: None  Occupational History  . None  Tobacco Use  . Smoking status: Never Smoker  . Smokeless tobacco: Never Used  Substance and Sexual  Activity  . Alcohol use: No  . Drug use: No  . Sexual activity: None  Other Topics Concern  . None  Social History Narrative  . None    PERFORMANCE STATUS: The patient's performance status is 0 - Asymptomatic  PHYSICAL EXAM: Most Recent Vital Signs: Blood pressure (!) 144/103, pulse (!) 110, temperature 97.8 F (36.6 C), temperature source Oral, resp. rate 18, weight 224 lb 3.2 oz (101.7 kg), SpO2 95 %.   Physical Exam  Constitutional: He is oriented to person, place, and time and well-developed, well-nourished, and in no distress.  HENT:  Head: Normocephalic and atraumatic.  Mouth/Throat: Oropharynx is clear and moist.  Eyes: Conjunctivae and EOM are normal. Pupils are equal, round, and reactive to light. No scleral icterus.  Neck: Trachea normal and normal range of motion. Neck supple.  Cardiovascular: Regular rhythm and normal heart sounds. Tachycardia present.  HR 110  Pulmonary/Chest: Effort normal and breath sounds normal.  Abdominal: Soft. Normal appearance and bowel sounds are normal. He exhibits no distension and no mass. There is no tenderness. There is no  rebound and no guarding.  Musculoskeletal: Normal range of motion.  Lymphadenopathy:    He has no cervical adenopathy.  Neurological: He is alert and oriented to person, place, and time. Gait normal.  Skin: Skin is warm, dry and intact.  Psychiatric: Mood, memory, affect and judgment normal.  Nursing note and vitals reviewed.   LABORATORY DATA:  CBC    Component Value Date/Time   WBC 8.4 12/08/2015 1333   RBC 5.28 12/08/2015 1333   HGB 16.6 12/08/2015 1333   HCT 46.5 12/08/2015 1333   PLT 172 12/08/2015 1333   MCV 88.1 12/08/2015 1333   MCH 31.4 12/08/2015 1333   MCHC 35.7 12/08/2015 1333   RDW 11.9 12/08/2015 1333   LYMPHSABS 2.7 12/08/2015 1333   MONOABS 0.7 12/08/2015 1333   EOSABS 0.3 12/08/2015 1333   BASOSABS 0.0 12/08/2015 1333     Chemistry      Component Value Date/Time   NA 135  12/08/2015 1333   K 3.6 12/08/2015 1333   CL 100 (L) 12/08/2015 1333   CO2 27 12/08/2015 1333   BUN 14 12/08/2015 1333   CREATININE 0.90 01/26/2016 1035      Component Value Date/Time   CALCIUM 9.0 12/08/2015 1333   ALKPHOS 122 12/08/2015 1333   AST 26 12/08/2015 1333   ALT 52 12/08/2015 1333   BILITOT 0.5 12/08/2015 1333     Lab Results  Component Value Date   ESRSEDRATE 2 12/08/2015     RADIOGRAPHY: I have reviewed the below radiographic studies and agree with their findings.   Study Result   CLINICAL DATA:  Restaging of follicular lymphoma diagnosed 18 months ago. Patient denies treatments. Diabetes. Hernia surgery.  EXAM: CT CHEST, ABDOMEN, AND PELVIS WITH CONTRAST  TECHNIQUE: Multidetector CT imaging of the chest, abdomen and pelvis was performed following the standard protocol during bolus administration of intravenous contrast.  CONTRAST:  190m ISOVUE-300 IOPAMIDOL (ISOVUE-300) INJECTION 61%  COMPARISON:  07/21/2015 PET of 03/21/2015.  FINDINGS: CT CHEST FINDINGS  Cardiovascular: Ascending aortic dilatation. This measures on the order of 4.2 cm on image 27/series 2. Similar to on the prior exam (when remeasured). Tortuous thoracic aorta. Borderline cardiomegaly. No pericardial effusion. No central pulmonary embolism, on this non-dedicated study.  Mediastinum/Nodes: No supraclavicular adenopathy. Index right axillary node measures 1.6 x 0.8 cm on image 19/series 2. This is unchanged. No axillary adenopathy. No mediastinal or hilar adenopathy.  Lungs/Pleura: No pleural fluid.  Clear lungs.  Musculoskeletal: No acute osseous abnormality.  CT ABDOMEN PELVIS FINDINGS  Hepatobiliary: Suspicion of mild hepatic steatosis. Sparing adjacent the gallbladder. No suspicious liver lesion. Hepatomegaly at 19.1 cm craniocaudal. Normal gallbladder, without biliary ductal dilatation.  Pancreas: Normal, without mass or ductal dilatation.  Spleen:  Normal in size, without focal abnormality.  Adrenals/Urinary Tract: Normal adrenal glands. Normal kidneys, without hydronephrosis. Normal urinary bladder.  Stomach/Bowel: Normal stomach, without wall thickening. Scattered colonic diverticula. Normal terminal ileum and appendix. Normal small bowel.  Vascular/Lymphatic: Aortic atherosclerosis. No retroperitoneal or retrocrural adenopathy. Again identified is jejunal mesenteric adenopathy. Index partially calcified nodal mass measures 3.5 x 2.1 cm today versus 4.0 by 2.3 cm on the prior. 5.0 cm craniocaudal on sagittal image 108 today versus 4.3 cm on the prior exam (when remeasured). More cephalad mesenteric node measures 10 mm on image 79/series 2 versus 12 mm on the prior exam (when remeasured).  Right external iliac node measures 1.3 x 3.3 cm on image 113/series 2. Compare 1.4 x 3.2 cm on the prior.  Left external iliac node measures 1.6 cm on image 114/series 2 versus 1.9 cm on the prior exam (when remeasured).  Reproductive: Normal prostate.  Other: No significant free fluid. No evidence of omental or peritoneal disease. Central and paracentral ventral abdominal wall hernia contains fat and is enlarged since the prior. No edema within the herniated fat today.  Musculoskeletal: Bilateral L5 pars defects. Interval healing of lucency about the right side of the L5 vertebral body. Example image 90/series 2. Degenerative disc disease at the lumbosacral junction  IMPRESSION: 1. Similar to slight response to therapy of abdominopelvic adenopathy. Although a dominant mesenteric mass measures smaller in transverse dimension today, is larger in greatest craniocaudal dimension. This suggests stability with measurement differences secondary to obliquity. A left external iliac node is slightly decreased in size while a right external iliac node is felt to be similar. 2. No new sites of disease identified. 3. Hepatic steatosis  and hepatomegaly. 4.  Aortic atherosclerosis. 5. Increased size of a fat containing ventral abdominal wall hernia. No residual edema to suggest strangulation.   Electronically Signed   By: Abigail Miyamoto M.D.   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  S/P mesenteric soft tissue biopsy by Dr. Ladona Horns on 03/31/2015 and bone marrow aspiration and biopsy on 06/23/2015 (both performed at [facility]). CT imaging reviewed with the patient, results are noted above.  There is no clinical indications for treatment or re imaging at this time.He continues to deny B symptoms. Per NCCN and guidelines will only re image if symptomatic.  Continue to reinforce maintaining a healthy lifestyle. 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 Information provided encouraging a healthy diet and to drink plenty of fluids including non-caffeinated beverages.  Hypertension: Encouraged compliance with blood pressure medicines.  Patient states his heart rate is elevated due to white coat syndrome.  Patient encouraged to take blood pressure often at home.  He admits to having a blood pressure cuff.  Return to clinic in 6 months for follow-up and labs.  (CBC, met C, LDH)  ORDERS PLACED FOR THIS ENCOUNTER: No orders of the defined types were placed in this encounter.   MEDICATIONS PRESCRIBED THIS ENCOUNTER: No orders of the defined types were placed in this encounter.   All questions were answered. The patient knows to call the clinic with any problems, questions or concerns. We can certainly see the patient much sooner if necessary.  Greater than 50% was spent in counseling and coordination of care with this patient including but not limited to discussion of the relevant topics above (See A&P) including, but not limited  to diagnosis and management of acute and chronic medical conditions.   This note is electronically signed by: Jacquelin Hawking, NP 03/22/2017 10:56 AM

## 2017-05-23 DIAGNOSIS — I10 Essential (primary) hypertension: Secondary | ICD-10-CM | POA: Diagnosis not present

## 2017-05-23 DIAGNOSIS — C8593 Non-Hodgkin lymphoma, unspecified, intra-abdominal lymph nodes: Secondary | ICD-10-CM | POA: Diagnosis not present

## 2017-05-23 DIAGNOSIS — E119 Type 2 diabetes mellitus without complications: Secondary | ICD-10-CM | POA: Diagnosis not present

## 2017-06-27 DIAGNOSIS — C8593 Non-Hodgkin lymphoma, unspecified, intra-abdominal lymph nodes: Secondary | ICD-10-CM | POA: Diagnosis not present

## 2017-06-27 DIAGNOSIS — E119 Type 2 diabetes mellitus without complications: Secondary | ICD-10-CM | POA: Diagnosis not present

## 2017-06-27 DIAGNOSIS — Z1389 Encounter for screening for other disorder: Secondary | ICD-10-CM | POA: Diagnosis not present

## 2017-06-27 DIAGNOSIS — I10 Essential (primary) hypertension: Secondary | ICD-10-CM | POA: Diagnosis not present

## 2017-08-01 DIAGNOSIS — I10 Essential (primary) hypertension: Secondary | ICD-10-CM | POA: Diagnosis not present

## 2017-08-01 DIAGNOSIS — Z6831 Body mass index (BMI) 31.0-31.9, adult: Secondary | ICD-10-CM | POA: Diagnosis not present

## 2018-03-03 ENCOUNTER — Other Ambulatory Visit (HOSPITAL_COMMUNITY): Payer: Self-pay | Admitting: Emergency Medicine

## 2018-03-03 DIAGNOSIS — C8258 Diffuse follicle center lymphoma, lymph nodes of multiple sites: Secondary | ICD-10-CM

## 2018-03-06 ENCOUNTER — Inpatient Hospital Stay (HOSPITAL_COMMUNITY): Payer: 59 | Attending: Hematology

## 2018-03-06 DIAGNOSIS — C8258 Diffuse follicle center lymphoma, lymph nodes of multiple sites: Secondary | ICD-10-CM | POA: Diagnosis not present

## 2018-03-06 LAB — CBC WITH DIFFERENTIAL/PLATELET
ABS IMMATURE GRANULOCYTES: 0.01 10*3/uL (ref 0.00–0.07)
BASOS PCT: 1 %
Basophils Absolute: 0 10*3/uL (ref 0.0–0.1)
Eosinophils Absolute: 0.2 10*3/uL (ref 0.0–0.5)
Eosinophils Relative: 4 %
HCT: 43.6 % (ref 39.0–52.0)
Hemoglobin: 14.8 g/dL (ref 13.0–17.0)
IMMATURE GRANULOCYTES: 0 %
Lymphocytes Relative: 31 %
Lymphs Abs: 1.8 10*3/uL (ref 0.7–4.0)
MCH: 30.4 pg (ref 26.0–34.0)
MCHC: 33.9 g/dL (ref 30.0–36.0)
MCV: 89.5 fL (ref 80.0–100.0)
Monocytes Absolute: 0.5 10*3/uL (ref 0.1–1.0)
Monocytes Relative: 9 %
NEUTROS ABS: 3.3 10*3/uL (ref 1.7–7.7)
Neutrophils Relative %: 55 %
PLATELETS: 177 10*3/uL (ref 150–400)
RBC: 4.87 MIL/uL (ref 4.22–5.81)
RDW: 11.8 % (ref 11.5–15.5)
WBC: 5.9 10*3/uL (ref 4.0–10.5)
nRBC: 0 % (ref 0.0–0.2)

## 2018-03-06 LAB — COMPREHENSIVE METABOLIC PANEL
ALT: 34 U/L (ref 0–44)
ANION GAP: 8 (ref 5–15)
AST: 18 U/L (ref 15–41)
Albumin: 4 g/dL (ref 3.5–5.0)
Alkaline Phosphatase: 92 U/L (ref 38–126)
BUN: 14 mg/dL (ref 6–20)
CO2: 27 mmol/L (ref 22–32)
Calcium: 8.8 mg/dL — ABNORMAL LOW (ref 8.9–10.3)
Chloride: 101 mmol/L (ref 98–111)
Creatinine, Ser: 0.89 mg/dL (ref 0.61–1.24)
GFR calc Af Amer: >60 mL/min (ref >60)
GFR calc non Af Amer: >60 mL/min (ref >60)
Glucose, Bld: 290 mg/dL — ABNORMAL HIGH (ref 70–99)
POTASSIUM: 3.5 mmol/L (ref 3.5–5.1)
Sodium: 136 mmol/L (ref 135–145)
Total Bilirubin: 0.7 mg/dL (ref 0.3–1.2)
Total Protein: 6.9 g/dL (ref 6.5–8.1)

## 2018-03-06 LAB — LACTATE DEHYDROGENASE: LDH: 100 U/L (ref 98–192)

## 2018-03-10 ENCOUNTER — Ambulatory Visit (HOSPITAL_COMMUNITY): Payer: 59

## 2018-03-13 ENCOUNTER — Encounter (HOSPITAL_COMMUNITY): Payer: Self-pay | Admitting: Internal Medicine

## 2018-03-13 ENCOUNTER — Inpatient Hospital Stay (HOSPITAL_COMMUNITY): Payer: 59 | Attending: Hematology | Admitting: Internal Medicine

## 2018-03-13 ENCOUNTER — Other Ambulatory Visit: Payer: Self-pay

## 2018-03-13 VITALS — BP 164/97 | HR 79 | Temp 97.6°F | Resp 16 | Wt 215.0 lb

## 2018-03-13 DIAGNOSIS — Z8572 Personal history of non-Hodgkin lymphomas: Secondary | ICD-10-CM | POA: Diagnosis not present

## 2018-03-13 DIAGNOSIS — C8258 Diffuse follicle center lymphoma, lymph nodes of multiple sites: Secondary | ICD-10-CM

## 2018-03-13 DIAGNOSIS — I10 Essential (primary) hypertension: Secondary | ICD-10-CM | POA: Diagnosis not present

## 2018-03-13 NOTE — Progress Notes (Signed)
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  Assessment and Plan:    ASSESSMENT/PLAN: 1.  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  S/P mesenteric soft tissue biopsy by Dr. Ladona Horns on 03/31/2015 and bone marrow aspiration and biopsy on 06/23/2015 (both performed at [facility]). CT CAP done 01/26/2016 showed  IMPRESSION: 1. Similar to slight response to therapy of abdominopelvic adenopathy. Although a dominant mesenteric mass measures smaller in transverse dimension today, is larger in greatest craniocaudal dimension. This suggests stability with measurement differences secondary to obliquity. A left external iliac node is slightly decreased in size while a right external iliac node is felt to be similar. 2. No new sites of disease identified. 3. Hepatic steatosis and hepatomegaly. 4.  Aortic atherosclerosis. 5. Increased size of a fat containing ventral abdominal wall hernia.  Labs done 03/06/2018 reviewed and showed WBC 5.9 HB 14.8 plts 177,000.  Chemistries WNL with K+ 3.5 Cr 0.89 and normal LFTs.  LDH 100   There is no clinical indications for treatment or re imaging at this time.He continues to deny B symptoms. Labs WNL.  Per NCCN and guidelines will only re image if symptomatic.  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  He should notify the office if any problems prior to that time.    2.  Hypertension: BP is 164/97.  Follow-up with PCP.   3.  Health maintenance. Pt reports he has not undergone colonoscopy.  Option for GI referral discussed.  Pt will follow-up with PCP to discuss.    Interval History:  Historical data obtained from note dated 03/14/2017.  Pt previously followed by NP  Burns.  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  S/P mesenteric soft tissue biopsy by Dr. Ladona Horns on 03/31/2015 and bone marrow aspiration and biopsy on 06/23/2015 (both performed at [facility]). Pt has remained on observation.    Current Status:  Pt is seen today for follow-up.  He denies any fevers, chills, night sweats and has noted no abdominal pain or adenopathy.    PATHOLOGY:       Non-Hodgkin's lymphoma ([place])   03/03/2015 Imaging    CT abd/pelvis- tiny periumbilical abdominal hernia containing fat, 5 cm irregular soft tissue mass in central small bowel mesentery, with mild adjacent lymphadenopathy.     03/21/2015 PET scan    1. Hypermetabolic central mesenteric mass. Hypermetabolic bilateral neck, bilateral axillary and bilateral pelvic lymphadenopathy. Findings are most suggestive of lymphoma. Tissue sampling is recommended. The most accessible pathologic targets are probably the axillary or inguinal nodes. 2. Additional findings include diffuse hepatic steatosis and small fat containing supraumbilical hernia.    04/01/2015 Procedure    Laparoscopic hernia repair with incidentally found mesenteric mass that was biopsied- Dr. Ladona Horns    04/01/2015 Pathology Results    NHL, B-cell type, follicular cell.    04/09/2015 Miscellaneous    Consultation with Dr. Jacquiline Doe at [facility]    06/23/2015 Bone Marrow Biopsy    Bone marrow aspiration and biopsy    07/21/2015 Imaging    CT CAP at [facility]- minimal decrease in size of abdominal mesenteric nodal mass and pelvic sidewall adenopathy.  Similar sized axillary nodes corresponding to hypermetabolism on prior PET.  No new sites of disease identified.  L5 pars defects.  01/26/2016 Imaging    CT CAP- 1. Similar to slight response to therapy of abdominopelvic adenopathy. Although a dominant mesenteric mass measures smaller in transverse dimension today, is larger in greatest  craniocaudal dimension. This suggests stability with measurement differences secondary to obliquity. A left external iliac node is slightly decreased in size while a right external iliac node is felt to be similar. 2. No new sites of disease identified. 3. Hepatic steatosis and hepatomegaly. 4. Aortic atherosclerosis. 5. Increased size of a fat containing ventral abdominal wall hernia. No residual edema to suggest strangulation.      Problem List Patient Active Problem List   Diagnosis Date Noted  . Non-Hodgkin's lymphoma ([place]) [C85.90] 12/08/2015  . Diffuse follicle center lymphoma ([place]) [C82.50] 07/24/2015  . 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 Non-Hodgkin lymphoma ([place]) 12/08/2015    Past Surgical History History reviewed. No pertinent surgical history.  Family History Family History  Problem Relation Age of Onset  . Diabetes Mother   . Heart disease Father      Social History  reports that he has never smoked. He has never used smokeless tobacco. He reports that he does not drink alcohol or use drugs.  Medications  Current Outpatient Medications:  .  amLODipine (NORVASC) 10 MG tablet, , Disp: , Rfl:  .  aspirin 81 MG chewable tablet, Chew by mouth., Disp: , Rfl:  .  cloNIDine (CATAPRES) 0.1 MG tablet, , Disp: , Rfl:  .  lisinopril-hydrochlorothiazide (PRINZIDE,ZESTORETIC) 20-12.5 MG tablet, , Disp: , Rfl:  .  metFORMIN (GLUCOPHAGE-XR) 500 MG 24 hr tablet, , Disp: , Rfl:   Allergies Patient has no allergy information on record.  Review of Systems Review of Systems - Oncology ROS negative   Physical Exam  Vitals Wt Readings from Last 3 Encounters:  03/13/18 215 lb (97.5 kg)  03/14/17 224 lb 3.2 oz (101.7 kg)  02/16/16 220 lb 6.4 oz (100 kg)   Temp Readings from Last 3 Encounters:  03/13/18 97.6 F (36.4 C) (Oral)  03/14/17  97.8 F (36.6 C) (Oral)  02/16/16 97.7 F (36.5 C) (Oral)   BP Readings from Last 3 Encounters:  03/13/18 (!) 164/97  03/14/17 (!) 144/103  02/16/16 (!) 140/96   Pulse Readings from Last 3 Encounters:  03/13/18 79  03/14/17 (!) 110  02/16/16 64   Constitutional: Well-developed, well-nourished, and in no distress.   HENT: Head: Normocephalic and atraumatic.  Mouth/Throat: No oropharyngeal exudate. Mucosa moist. Eyes: Pupils are equal, round, and reactive to light. Conjunctivae are normal. No scleral icterus.  Neck: Normal range of motion. Neck supple. No JVD present.  Cardiovascular: Normal rate, regular rhythm and normal heart sounds.  Exam reveals no gallop and no friction rub.   No murmur heard. Pulmonary/Chest: Effort normal and breath sounds normal. No respiratory distress. No wheezes.No rales.  Abdominal: Soft. Bowel sounds are normal. No distension. There is no tenderness. There is no guarding.  Musculoskeletal: No edema or tenderness.  Lymphadenopathy: No cervical, axillary or supraclavicular adenopathy.  Neurological: Alert and oriented to person, place, and time. No cranial nerve deficit.  Skin: Skin is warm and dry. No rash noted. No erythema. No pallor.  Psychiatric: Affect and judgment normal.   Labs No visits with results within 3 Day(s) from this visit.  Latest known visit with results is:  Lab on 03/06/2018  Component Date Value Ref Range  Status  . LDH 03/06/2018 100  98 - 192 U/L Final   Performed at [facility], [address], [place], [place] [address]  . Sodium 03/06/2018 136  135 - 145 mmol/L Final  . Potassium 03/06/2018 3.5  3.5 - 5.1 mmol/L Final  . Chloride 03/06/2018 101  98 - 111 mmol/L Final  . CO2 03/06/2018 27  22 - 32 mmol/L Final  . Glucose, Bld 03/06/2018 290* 70 - 99 mg/dL Final  . BUN 03/06/2018 14  6 - 20 mg/dL Final  . Creatinine, Ser 03/06/2018 0.89  0.61 - 1.24 mg/dL Final  . Calcium 03/06/2018 8.8* 8.9 - 10.3 mg/dL Final  . Total  Protein 03/06/2018 6.9  6.5 - 8.1 g/dL Final  . Albumin 03/06/2018 4.0  3.5 - 5.0 g/dL Final  . AST 03/06/2018 18  15 - 41 U/L Final  . ALT 03/06/2018 34  0 - 44 U/L Final  . Alkaline Phosphatase 03/06/2018 92  38 - 126 U/L Final  . Total Bilirubin 03/06/2018 0.7  0.3 - 1.2 mg/dL Final  . GFR calc non Af Amer 03/06/2018 >60  >60 mL/min Final  . GFR calc Af Amer 03/06/2018 >60  >60 mL/min Final  . Anion gap 03/06/2018 8  5 - 15 Final   Performed at [facility], [address], [place], [place] [address]  . WBC 03/06/2018 5.9  4.0 - 10.5 K/uL Final  . RBC 03/06/2018 4.87  4.22 - 5.81 MIL/uL Final  . Hemoglobin 03/06/2018 14.8  13.0 - 17.0 g/dL Final  . HCT 03/06/2018 43.6  39.0 - 52.0 % Final  . MCV 03/06/2018 89.5  80.0 - 100.0 fL Final  . MCH 03/06/2018 30.4  26.0 - 34.0 pg Final  . MCHC 03/06/2018 33.9  30.0 - 36.0 g/dL Final  . RDW 03/06/2018 11.8  11.5 - 15.5 % Final  . Platelets 03/06/2018 177  150 - 400 K/uL Final  . nRBC 03/06/2018 0.0  0.0 - 0.2 % Final  . Neutrophils Relative % 03/06/2018 55  % Final  . Neutro Abs 03/06/2018 3.3  1.7 - 7.7 K/uL Final  . Lymphocytes Relative 03/06/2018 31  % Final  . Lymphs Abs 03/06/2018 1.8  0.7 - 4.0 K/uL Final  . Monocytes Relative 03/06/2018 9  % Final  . Monocytes Absolute 03/06/2018 0.5  0.1 - 1.0 K/uL Final  . Eosinophils Relative 03/06/2018 4  % Final  . Eosinophils Absolute 03/06/2018 0.2  0.0 - 0.5 K/uL Final  . Basophils Relative 03/06/2018 1  % Final  . Basophils Absolute 03/06/2018 0.0  0.0 - 0.1 K/uL Final  . Immature Granulocytes 03/06/2018 0  % Final  . Abs Immature Granulocytes 03/06/2018 0.01  0.00 - 0.07 K/uL Final   Performed at [facility], [address], [place], [place] [address]     Pathology Orders Placed This Encounter  Procedures  . CBC with Differential/Platelet    Standing Status:   Future    Standing Expiration Date:   03/13/2020  . Comprehensive metabolic panel    Standing Status:   Future    Standing  Expiration Date:   03/13/2020  . Lactate dehydrogenase    Standing Status:   Future    Standing Expiration Date:   03/13/2020       Zoila Shutter MD

## 2018-07-29 IMAGING — CT CT ABD-PELV W/ CM
2 of 5 series · 12 of 36 positions shown, 15 images · IV contrast (Isovue)
Comparison: 07/21/2015 PET of 03/21/2015.

CLINICAL DATA: Restaging of follicular lymphoma diagnosed 18 months
ago. Patient denies treatments. Diabetes. Hernia surgery.

EXAM:
CT CHEST, ABDOMEN, AND PELVIS WITH CONTRAST
TECHNIQUE: Multidetector CT imaging of the chest, abdomen and pelvis was
performed following the standard protocol during bolus
administration of intravenous contrast.
CONTRAST:  100mL RWGWJT-ZYY IOPAMIDOL (RWGWJT-ZYY) INJECTION 61%

[Series 2: cap with · axial · 0.92mm/px · z∈[+960,+1515]mm · 9 of 135 slices shown, 12 images]
[im 12/135  mediastinal]
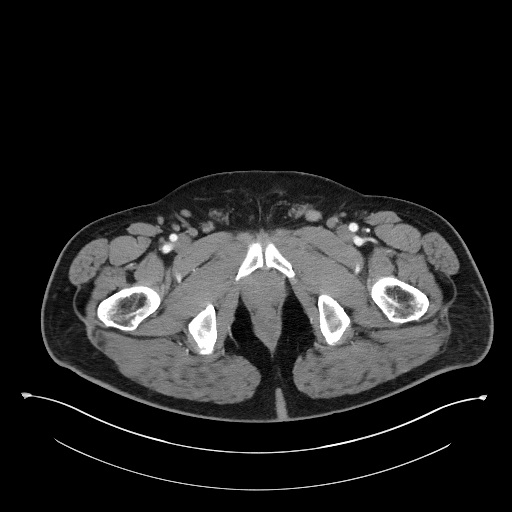
[im 12/135  lung]
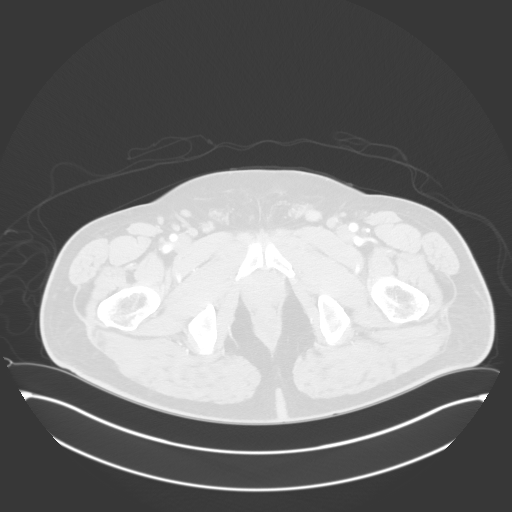
[im 23/135  lung]
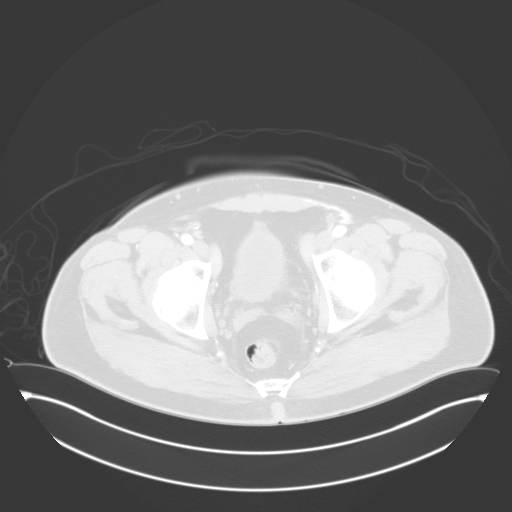
[im 45/135  lung]
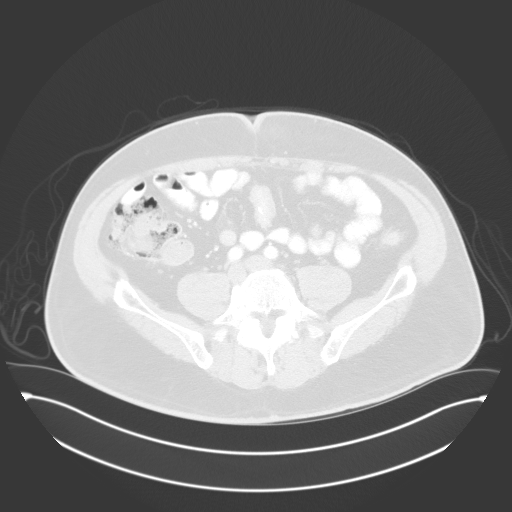
[im 56/135  lung]
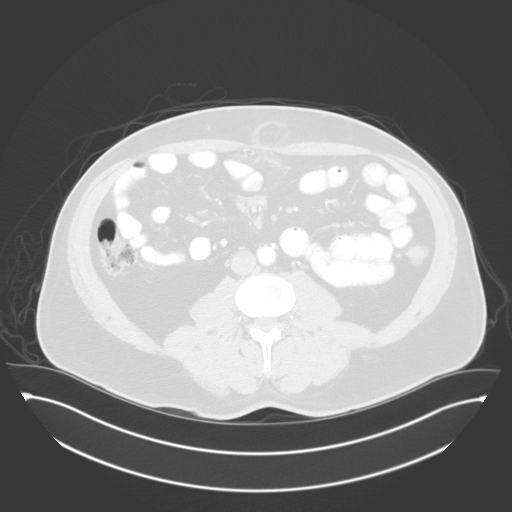
[im 68/135  mediastinal]
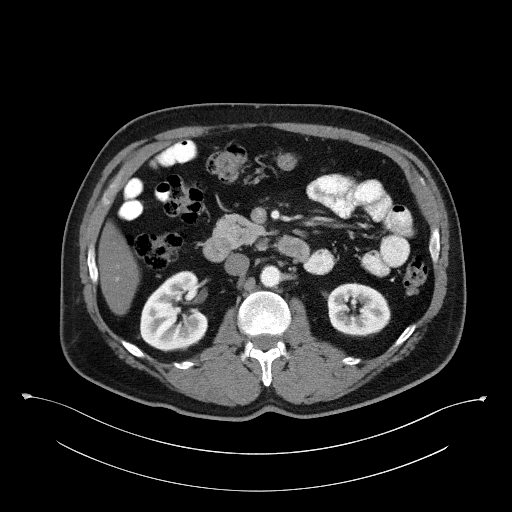
[im 68/135  lung]
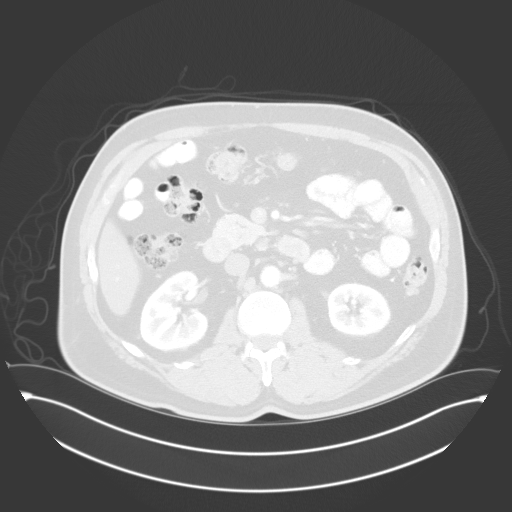
[im 79/135  lung]
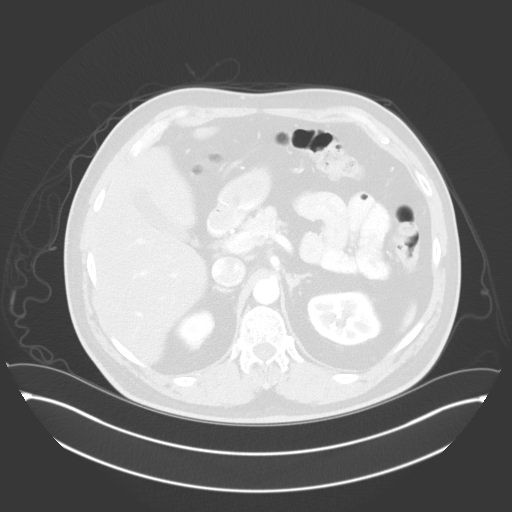
[im 90/135  lung]
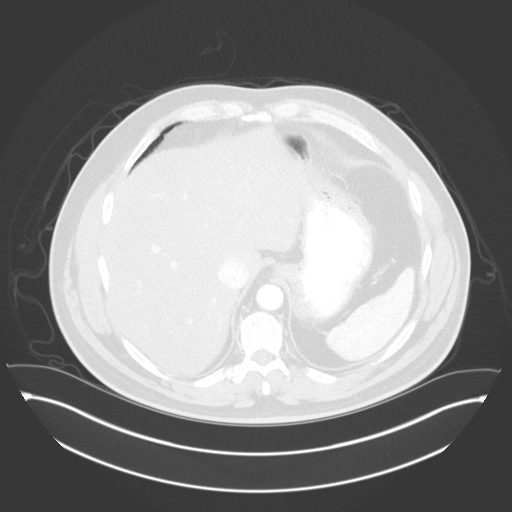
[im 112/135  lung]
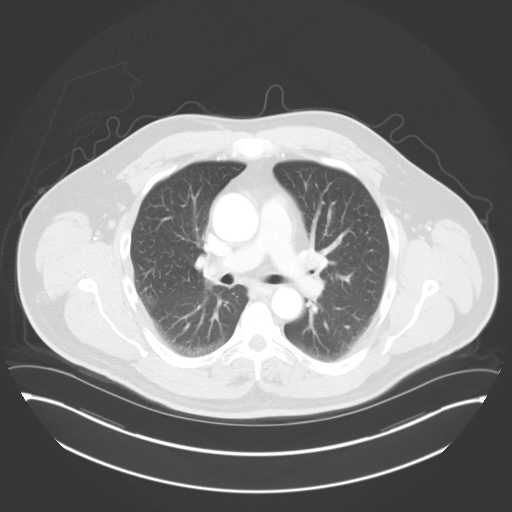
[im 123/135  mediastinal]
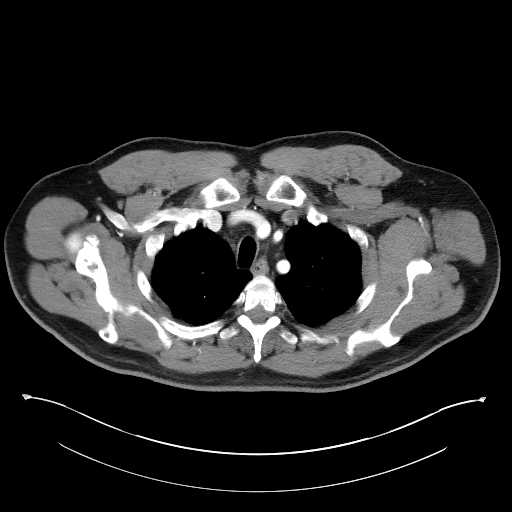
[im 123/135  lung]
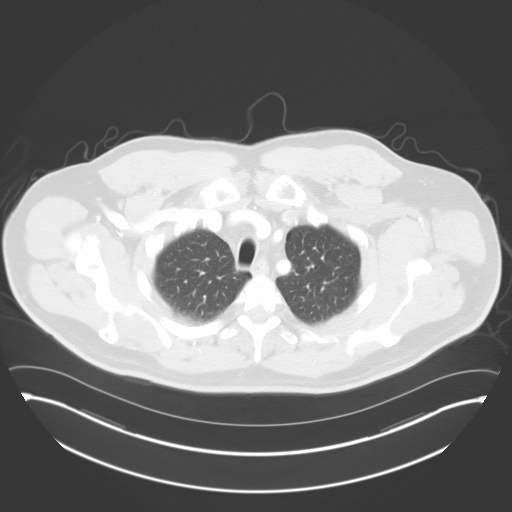

[Series 5: coronals · coronal · 0.82mm/px · 3 of 161 slices shown]
[im 33/161  lung]
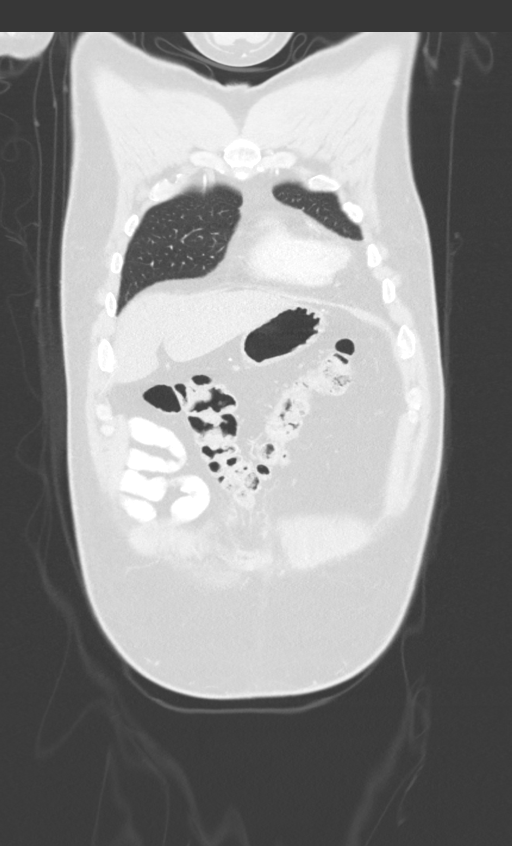
[im 65/161  lung]
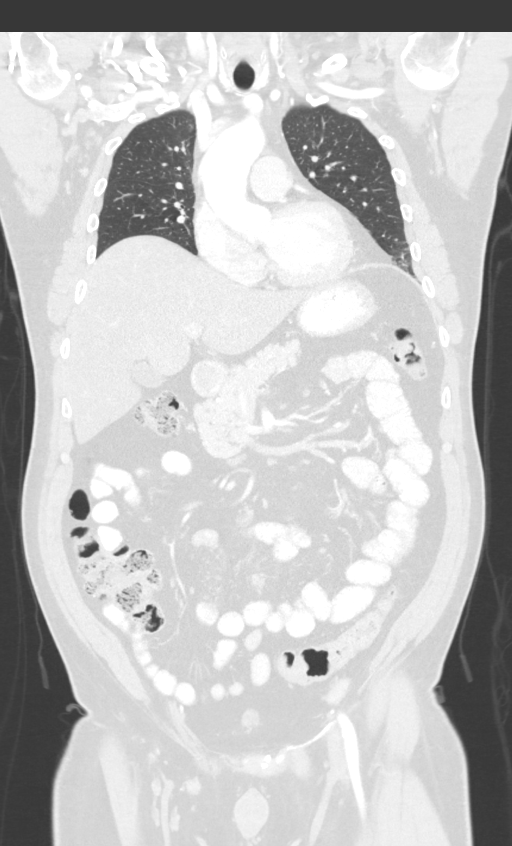
[im 97/161  lung]
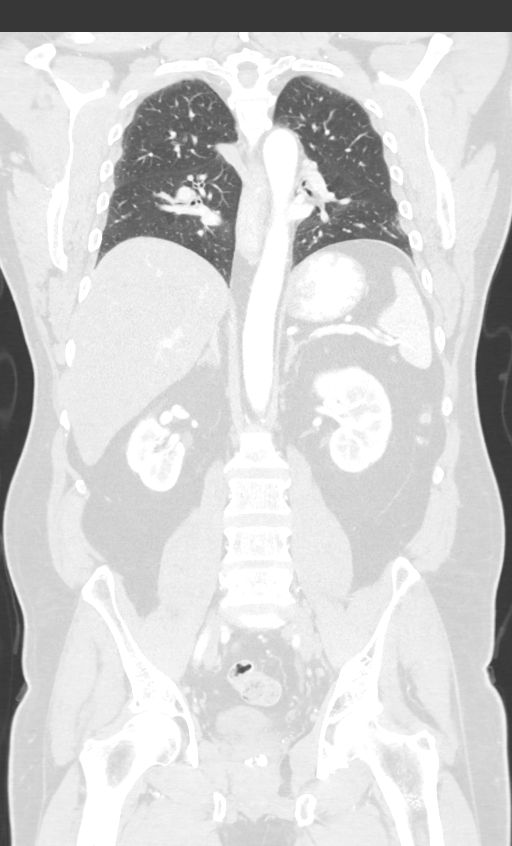

[12 of 36 positions shown; findings below may reference images not displayed]

FINDINGS: CT CHEST FINDINGS

Cardiovascular: Ascending aortic dilatation. This measures on the
order of 4.2 cm on image 27/series 2. Similar to on the prior exam
(when remeasured). Tortuous thoracic aorta. Borderline cardiomegaly.
No pericardial effusion. No central pulmonary embolism, on this
non-dedicated study.

Mediastinum/Nodes: No supraclavicular adenopathy. Index right
axillary node measures 1.6 x 0.8 cm on image 19/series 2. This is
unchanged. No axillary adenopathy. No mediastinal or hilar
adenopathy.

Lungs/Pleura: No pleural fluid.  Clear lungs.

Musculoskeletal: No acute osseous abnormality.

CT ABDOMEN PELVIS FINDINGS

Hepatobiliary: Suspicion of mild hepatic steatosis. Sparing adjacent
the gallbladder. No suspicious liver lesion. Hepatomegaly at 19.1 cm
craniocaudal. Normal gallbladder, without biliary ductal dilatation.

Pancreas: Normal, without mass or ductal dilatation.

Spleen: Normal in size, without focal abnormality.

Adrenals/Urinary Tract: Normal adrenal glands. Normal kidneys,
without hydronephrosis. Normal urinary bladder.

Stomach/Bowel: Normal stomach, without wall thickening. Scattered
colonic diverticula. Normal terminal ileum and appendix. Normal
small bowel.

Vascular/Lymphatic: Aortic atherosclerosis. No retroperitoneal or
retrocrural adenopathy. Again identified is jejunal mesenteric
adenopathy. Index partially calcified nodal mass measures 3.5 x
cm today versus 4.0 by 2.3 cm on the prior. 5.0 cm craniocaudal on
sagittal image 108 today versus 4.3 cm on the prior exam (when
remeasured). More cephalad mesenteric node measures 10 mm on image
79/series 2 versus 12 mm on the prior exam (when remeasured).

Right external iliac node measures 1.3 x 3.3 cm on image 113/series
2. Compare 1.4 x 3.2 cm on the prior.

Left external iliac node measures 1.6 cm on image 114/series 2
versus 1.9 cm on the prior exam (when remeasured).

Reproductive: Normal prostate.

Other: No significant free fluid. No evidence of omental or
peritoneal disease. Central and paracentral ventral abdominal wall
hernia contains fat and is enlarged since the prior. No edema within
the herniated fat today.

Musculoskeletal: Bilateral L5 pars defects. Interval healing of
lucency about the right side of the L5 vertebral body. Example image
90/series 2. Degenerative disc disease at the lumbosacral junction
IMPRESSION: 1. Similar to slight response to therapy of abdominopelvic
adenopathy. Although a dominant mesenteric mass measures smaller in
transverse dimension today, is larger in greatest craniocaudal
dimension. This suggests stability with measurement differences
secondary to obliquity. A left external iliac node is slightly
decreased in size while a right external iliac node is felt to be
similar.
2. No new sites of disease identified.
3. Hepatic steatosis and hepatomegaly.
4.  Aortic atherosclerosis.
5. Increased size of a fat containing ventral abdominal wall hernia.
No residual edema to suggest strangulation.

## 2019-03-09 ENCOUNTER — Other Ambulatory Visit (HOSPITAL_COMMUNITY): Payer: Self-pay

## 2019-03-09 DIAGNOSIS — C8258 Diffuse follicle center lymphoma, lymph nodes of multiple sites: Secondary | ICD-10-CM

## 2019-03-12 ENCOUNTER — Other Ambulatory Visit (HOSPITAL_COMMUNITY): Payer: 59

## 2019-03-12 ENCOUNTER — Inpatient Hospital Stay (HOSPITAL_COMMUNITY): Payer: 59 | Attending: Hematology

## 2019-03-12 ENCOUNTER — Other Ambulatory Visit: Payer: Self-pay

## 2019-03-12 DIAGNOSIS — Z8572 Personal history of non-Hodgkin lymphomas: Secondary | ICD-10-CM | POA: Insufficient documentation

## 2019-03-12 DIAGNOSIS — C8258 Diffuse follicle center lymphoma, lymph nodes of multiple sites: Secondary | ICD-10-CM

## 2019-03-12 LAB — CBC WITH DIFFERENTIAL/PLATELET
Abs Immature Granulocytes: 0.01 10*3/uL (ref 0.00–0.07)
Basophils Absolute: 0.1 10*3/uL (ref 0.0–0.1)
Basophils Relative: 1 %
Eosinophils Absolute: 0.2 10*3/uL (ref 0.0–0.5)
Eosinophils Relative: 4 %
HCT: 43.2 % (ref 39.0–52.0)
Hemoglobin: 14.5 g/dL (ref 13.0–17.0)
Immature Granulocytes: 0 %
Lymphocytes Relative: 30 %
Lymphs Abs: 1.7 10*3/uL (ref 0.7–4.0)
MCH: 30.9 pg (ref 26.0–34.0)
MCHC: 33.6 g/dL (ref 30.0–36.0)
MCV: 91.9 fL (ref 80.0–100.0)
Monocytes Absolute: 0.4 10*3/uL (ref 0.1–1.0)
Monocytes Relative: 8 %
Neutro Abs: 3.3 10*3/uL (ref 1.7–7.7)
Neutrophils Relative %: 57 %
Platelets: 168 10*3/uL (ref 150–400)
RBC: 4.7 MIL/uL (ref 4.22–5.81)
RDW: 11.9 % (ref 11.5–15.5)
WBC: 5.7 10*3/uL (ref 4.0–10.5)
nRBC: 0 % (ref 0.0–0.2)

## 2019-03-12 LAB — COMPREHENSIVE METABOLIC PANEL
ALT: 21 U/L (ref 0–44)
AST: 12 U/L — ABNORMAL LOW (ref 15–41)
Albumin: 3.9 g/dL (ref 3.5–5.0)
Alkaline Phosphatase: 114 U/L (ref 38–126)
Anion gap: 6 (ref 5–15)
BUN: 17 mg/dL (ref 6–20)
CO2: 26 mmol/L (ref 22–32)
Calcium: 8.7 mg/dL — ABNORMAL LOW (ref 8.9–10.3)
Chloride: 103 mmol/L (ref 98–111)
Creatinine, Ser: 0.88 mg/dL (ref 0.61–1.24)
GFR calc Af Amer: >60 mL/min (ref >60)
GFR calc non Af Amer: >60 mL/min (ref >60)
Glucose, Bld: 438 mg/dL — ABNORMAL HIGH (ref 70–99)
Potassium: 4.3 mmol/L (ref 3.5–5.1)
Sodium: 135 mmol/L (ref 135–145)
Total Bilirubin: 0.6 mg/dL (ref 0.3–1.2)
Total Protein: 6.3 g/dL — ABNORMAL LOW (ref 6.5–8.1)

## 2019-03-12 LAB — LACTATE DEHYDROGENASE: LDH: 92 U/L — ABNORMAL LOW (ref 98–192)

## 2019-03-19 ENCOUNTER — Ambulatory Visit (HOSPITAL_COMMUNITY): Payer: 59 | Admitting: Hematology

## 2019-04-02 ENCOUNTER — Other Ambulatory Visit: Payer: Self-pay

## 2019-04-02 ENCOUNTER — Encounter (HOSPITAL_COMMUNITY): Payer: Self-pay | Admitting: Hematology

## 2019-04-02 ENCOUNTER — Inpatient Hospital Stay (HOSPITAL_BASED_OUTPATIENT_CLINIC_OR_DEPARTMENT_OTHER): Payer: 59 | Admitting: Hematology

## 2019-04-02 VITALS — BP 139/94 | HR 85 | Temp 97.9°F | Resp 17 | Wt 193.0 lb

## 2019-04-02 DIAGNOSIS — C8293 Follicular lymphoma, unspecified, intra-abdominal lymph nodes: Secondary | ICD-10-CM | POA: Diagnosis not present

## 2019-04-02 DIAGNOSIS — C8258 Diffuse follicle center lymphoma, lymph nodes of multiple sites: Secondary | ICD-10-CM

## 2019-04-02 DIAGNOSIS — Z8572 Personal history of non-Hodgkin lymphomas: Secondary | ICD-10-CM | POA: Diagnosis not present

## 2019-04-02 NOTE — Patient Instructions (Addendum)
[facility] at [facility] Discharge Instructions  You were seen today by Dr. Katragadda. He went over your recent lab results. He will see you back in 1 year for labs and follow up.   Thank you for choosing [facility] at [facility] to provide your oncology and hematology care.  To afford each patient quality time with our provider, please arrive at least 15 minutes before your scheduled appointment time.   If you have a lab appointment with the Cancer Center please come in thru the  Main Entrance and check in at the main information desk  You need to re-schedule your appointment should you arrive 10 or more minutes late.  We strive to give you quality time with our providers, and arriving late affects you and other patients whose appointments are after yours.  Also, if you no show three or more times for appointments you may be dismissed from the clinic at the providers discretion.     Again, thank you for choosing [facility].  Our hope is that these requests will decrease the amount of time that you wait before being seen by our physicians.       _____________________________________________________________  Should you have questions after your visit to  Cancer Center, please contact our office at [phone] between the hours of 8:00 a.m. and 4:30 p.m.  Voicemails left after 4:00 p.m. will not be returned until the following business day.  For prescription refill requests, have your pharmacy contact our office and allow 72 hours.    Cancer Center Support Programs:   > Cancer Support Group  2nd Tuesday of the month 1pm-2pm, Journey Room    

## 2019-04-02 NOTE — Progress Notes (Signed)
[place], [place] [address]   CLINIC:  Medical Oncology/Hematology  PCP:  Rosine Door [place] [id number] [phone]   REASON FOR VISIT:  Follow-up for follicular lymphoma.  CURRENT THERAPY: Observation.  BRIEF ONCOLOGIC HISTORY:  Oncology History  Non-Hodgkin's lymphoma ([place])  03/03/2015 Imaging   CT abd/pelvis- tiny periumbilical abdominal hernia containing fat, 5 cm irregular soft tissue mass in central small bowel mesentery, with mild adjacent lymphadenopathy.    03/21/2015 PET scan   1. Hypermetabolic central mesenteric mass. Hypermetabolic bilateral neck, bilateral axillary and bilateral pelvic lymphadenopathy. Findings are most suggestive of lymphoma. Tissue sampling is recommended. The most accessible pathologic targets are probably the axillary or inguinal nodes. 2. Additional findings include diffuse hepatic steatosis and small fat containing supraumbilical hernia.   04/01/2015 Procedure   Laparoscopic hernia repair with incidentally found mesenteric mass that was biopsied- Dr. Ladona Horns   04/01/2015 Pathology Results   NHL, B-cell type, follicular cell.   04/09/2015 Miscellaneous   Consultation with Dr. Jacquiline Doe at [facility]   06/23/2015 Bone Marrow Biopsy   Bone marrow aspiration and biopsy   07/21/2015 Imaging   CT CAP at [facility]- minimal decrease in size of abdominal mesenteric nodal mass and pelvic sidewall adenopathy.  Similar sized axillary nodes corresponding to hypermetabolism on prior PET.  No new sites of disease identified.  L5 pars defects.   01/26/2016 Imaging   CT CAP- 1. Similar to slight response to therapy of abdominopelvic adenopathy. Although a dominant mesenteric mass measures smaller in transverse dimension today, is larger in greatest craniocaudal dimension. This suggests stability with measurement differences secondary to obliquity. A left external iliac node is  slightly decreased in size while a right external iliac node is felt to be similar. 2. No new sites of disease identified. 3. Hepatic steatosis and hepatomegaly. 4. Aortic atherosclerosis. 5. Increased size of a fat containing ventral abdominal wall hernia. No residual edema to suggest strangulation.      CANCER STAGING: Cancer Staging No matching staging information was found for the patient.   INTERVAL HISTORY:  Kevin Good 54 y.o. male seen for follow-up of follicular lymphoma.  Appetite and energy levels are 100%.  Continuing to work as a Administrator.  Denies any fevers or night sweats.  Lost about 22 pounds in the last 1 year but was trying to lose weight.  Denies any abdominal pain or cramping.    REVIEW OF SYSTEMS:  Review of Systems  All other systems reviewed and are negative.    PAST MEDICAL/SURGICAL HISTORY:  Past Medical History:  Diagnosis Date  . Non-Hodgkin lymphoma ([place]) 12/08/2015   History reviewed. No pertinent surgical history.   SOCIAL HISTORY:  Social History   Socioeconomic History  . Marital status: Married    Spouse name: Not on file  . Number of children: Not on file  . Years of education: Not on file  . Highest education level: Not on file  Occupational History  . Not on file  Tobacco Use  . Smoking status: Never Smoker  . Smokeless tobacco: Never Used  Substance and Sexual Activity  . Alcohol use: No  . Drug use: No  . Sexual activity: Not on file  Other Topics Concern  . Not on file  Social History Narrative  . Not on file   Social Determinants of Health   Financial Resource Strain:   . Difficulty of Paying Living Expenses: Not on file  Food Insecurity:   . Worried About Charity fundraiser in the Last Year: Not on file  . Ran Out of Food in the Last Year: Not on file  Transportation Needs:   . Lack of Transportation (Medical): Not on file  . Lack of Transportation (Non-Medical): Not on file  Physical Activity:   .  Days of Exercise per Week: Not on file  . Minutes of Exercise per Session: Not on file  Stress:   . Feeling of Stress : Not on file  Social Connections:   . Frequency of Communication with Friends and Family: Not on file  . Frequency of Social Gatherings with Friends and Family: Not on file  . Attends Religious Services: Not on file  . Active Member of Clubs or Organizations: Not on file  . Attends Archivist Meetings: Not on file  . Marital Status: Not on file  Intimate Partner Violence:   . Fear of Current or Ex-Partner: Not on file  . Emotionally Abused: Not on file  . Physically Abused: Not on file  . Sexually Abused: Not on file    FAMILY HISTORY:  Family History  Problem Relation Age of Onset  . Diabetes Mother   . Heart disease Father     CURRENT MEDICATIONS:  Outpatient Encounter Medications as of 04/02/2019  Medication Sig  . amLODipine (NORVASC) 10 MG tablet   . aspirin 81 MG chewable tablet Chew by mouth.  . cloNIDine (CATAPRES) 0.1 MG tablet   . lisinopril-hydrochlorothiazide (PRINZIDE,ZESTORETIC) 20-12.5 MG tablet   . metFORMIN (GLUCOPHAGE-XR) 500 MG 24 hr tablet    No facility-administered encounter medications on file as of 04/02/2019.    ALLERGIES:  Not on File   PHYSICAL EXAM:  ECOG Performance status: 0  Vitals:   04/02/19 1142  BP: (!) 139/94  Pulse: 85  Resp: 17  Temp: 97.9 F (36.6 C)  SpO2: 99%   Filed Weights   04/02/19 1142  Weight: 193 lb (87.5 kg)    Physical Exam Vitals reviewed.  Constitutional:      Appearance: Normal appearance.  Cardiovascular:     Rate and Rhythm: Normal rate and regular rhythm.     Heart sounds: Normal heart sounds.  Pulmonary:     Effort: Pulmonary effort is normal.     Breath sounds: Normal breath sounds.  Abdominal:     General: There is no distension.     Palpations: Abdomen is soft. There is no mass.  Lymphadenopathy:     Cervical: No cervical adenopathy.  Skin:    General: Skin is  warm.  Neurological:     General: No focal deficit present.     Mental Status: He is alert and oriented to person, place, and time.  Psychiatric:        Mood and Affect: Mood normal.        Behavior: Behavior normal.    Ventral abdominal wall hernia present.  LABORATORY DATA:  I have reviewed the labs as listed.  CBC    Component Value Date/Time   WBC 5.7 03/12/2019 1023   RBC 4.70 03/12/2019 1023   HGB 14.5 03/12/2019 1023   HCT 43.2 03/12/2019 1023   PLT 168 03/12/2019 1023   MCV 91.9 03/12/2019 1023   MCH 30.9 03/12/2019 1023   MCHC 33.6 03/12/2019 1023   RDW 11.9 03/12/2019 1023   LYMPHSABS 1.7 03/12/2019 1023   MONOABS 0.4 03/12/2019 1023   EOSABS 0.2 03/12/2019 1023   BASOSABS 0.1 03/12/2019  1023   CMP Latest Ref Rng & Units 03/12/2019 03/06/2018 01/26/2016  Glucose 70 - 99 mg/dL 438(H) 290(H) -  BUN 6 - 20 mg/dL 17 14 -  Creatinine 0.61 - 1.24 mg/dL 0.88 0.89 0.90  Sodium 135 - 145 mmol/L 135 136 -  Potassium 3.5 - 5.1 mmol/L 4.3 3.5 -  Chloride 98 - 111 mmol/L 103 101 -  CO2 22 - 32 mmol/L 26 27 -  Calcium 8.9 - 10.3 mg/dL 8.7(L) 8.8(L) -  Total Protein 6.5 - 8.1 g/dL 6.3(L) 6.9 -  Total Bilirubin 0.3 - 1.2 mg/dL 0.6 0.7 -  Alkaline Phos 38 - 126 U/L 114 92 -  AST 15 - 41 U/L 12(L) 18 -  ALT 0 - 44 U/L 21 34 -       DIAGNOSTIC IMAGING:  I have independently reviewed the scans and discussed with the patient.      ASSESSMENT & PLAN:   Non-Hodgkin's lymphoma ([place]) 1.  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 -Status post biopsy on 9/41/7408 showing follicular lymphoma.  Bone marrow aspiration biopsy in May 2017 was negative. -Last CT AP on 01/26/2016 shows calcified nodal mass measuring 3.5 x 2.1 cm and the more cephalad mesenteric node measuring 10 mm.  Bilateral external iliac lymphadenopathy. -Patient lost 22 pounds in the last 1 year, but was trying  to lose weight.  He works as a Administrator.  Denies any fevers or night sweats. -We reviewed his labs which showed normal LDH, hemoglobin and LFTs.  Creatinine was normal. -Physical examination did not show any peripheral lymphadenopathy.  No splenomegaly. -He will be seen back in 1 year for follow-up.  He was told to see [place] sooner should he develop any fevers, night sweats or unintentional weight loss.      Orders placed this encounter:  Orders Placed This Encounter  Procedures  . CBC with Differential/Platelet  . Comprehensive metabolic panel  . Lactate dehydrogenase      Derek Jack, MD [place] [phone]

## 2019-04-02 NOTE — Assessment & Plan Note (Signed)
1.  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 -Status post biopsy on [id number] showing follicular lymphoma.  Bone marrow aspiration biopsy in May 2017 was negative. -Last CT AP on 01/26/2016 shows calcified nodal mass measuring 3.5 x 2.1 cm and the more cephalad mesenteric node measuring 10 mm.  Bilateral external iliac lymphadenopathy. -Patient lost 22 pounds in the last 1 year, but was trying to lose weight.  He works as a Administrator.  Denies any fevers or night sweats. -We reviewed his labs which showed normal LDH, hemoglobin and LFTs.  Creatinine was normal. -Physical examination did not show any peripheral lymphadenopathy.  No splenomegaly. -He will be seen back in 1 year for follow-up.  He was told to see [place] sooner should he develop any fevers, night sweats or unintentional weight loss.

## 2020-03-10 ENCOUNTER — Ambulatory Visit: Payer: Self-pay | Admitting: Surgery

## 2020-03-10 NOTE — H&P (Signed)
Kevin Good Appointment: 03/10/2020 1:50 PM Location: [facility] Patient #: [id number] DOB: 02-Nov-1965 Separated / Language: Kevin Good / Race: White Male  History of Present Illness Kevin Good A. Teairra Millar MD; 03/10/2020 1:58 PM) Patient words: Patient presents for evaluation of ventral hernia. This is present since 2017. He has been observed for a follicular lymphoma by medical oncology at any pin. The hernia is causing more discomfort especially with lifting, coughing or pushing. Locations around the umbilicus. The bulge is located just the region superior and to the left of the umbilicus. He has had no recent imaging of this. He has had some bouts of constipation but nothing that is persistent. His bowels have been moving recently. He has no nausea or vomiting. He is eating more discomfort in the area especially with exertion. The hernia has "stuck out" since 2017 he states.  The patient is a 55 year old male.   Allergies (Kevin Good, CMA; 03/10/2020 1:34 PM) No Known Drug Allergies [03/10/2020]: No Known Allergies [03/10/2020]: Allergies Reconciled  Medication History (Kevin Good, CMA; 03/10/2020 1:34 PM) Lisinopril-hydroCHLOROthiazide (20-12.5MG  Tablet, Oral) Active. MetFORMIN & Diet Manage Prod (500MG  Misc, Oral) Active. Medications Reconciled    Vitals (Kevin Good CMA; 03/10/2020 1:35 PM) 03/10/2020 1:35 PM Weight: 209.25 lb Height: 70in Body Surface Area: 2.13 m Body Mass Index: 30.02 kg/m  Temp.: 97.21F  Pulse: 112 (Regular)  P.OX: 95% (Room air) BP: 132/88(Sitting, Left Arm, Standard)        Physical Exam (Kevin Huxford A. Isaura Schiller MD; 03/10/2020 1:59 PM)  General Mental Status-Alert. General Appearance-Consistent with stated age. Hydration-Well hydrated. Voice-Normal.  Chest and Lung Exam Chest and lung exam reveals -quiet, even and easy respiratory effort with no use of accessory muscles  and on auscultation, normal breath sounds, no adventitious sounds and normal vocal resonance. Inspection Chest Wall - Normal. Back - normal.  Cardiovascular Cardiovascular examination reveals -normal heart sounds, regular rate and rhythm with no murmurs and normal pedal pulses bilaterally.  Abdomen Note: Soft nontender. Large periumbilical hernia just to the left and superior to the umbilicus which is very soft but large. No rebound or guarding or pain with reduction of hernia.  Neurologic Neurologic evaluation reveals -alert and oriented x 3 with no impairment of recent or remote memory. Mental Status-Normal.  Musculoskeletal Normal Exam - Left-Upper Extremity Strength Normal and Lower Extremity Strength Normal. Normal Exam - Right-Upper Extremity Strength Normal and Lower Extremity Strength Normal.    Assessment & Plan (Kevin Jou A. Derwin Reddy MD; 03/10/2020 1:57 PM)  VENTRAL HERNIA WITHOUT OBSTRUCTION OR GANGRENE (K43.9) Impression: Present 2017. No recent imaging therefore recommended computed tomography scan to further evaluate hernia size. It appears to be causing more discomfort to the patient and therefore he desires repair. Discussed laparoscopic and open approaches with mesh. Approach will depend on overall size. On the CT from 2017, this appears to be an umbilical hernia. I discussed the pros and cons of laparoscopic versus open approaches, the use of mesh and complications of mesh as well as complications of hernia surgery in general with him today. The risk of hernia repair include bleeding, infection, organ injury, bowel injury, bladder injury, nerve injury recurrent hernia, blood clots, worsening of underlying condition, chronic pain, mesh use, open surgery, death, and the need for other operations. Pt agrees to proceed  Current Plans You are being scheduled for surgery- Our schedulers will call you.  You should hear from our office's scheduling department within 5  working days about the location, date, and time  of surgery. We try to make accommodations for patient's preferences in scheduling surgery, but sometimes the OR schedule or the surgeon's schedule prevents [place] from making those accommodations.  If you have not heard from our office [phone]) in 5 working days, call the office and ask for your surgeon's nurse.  If you have other questions about your diagnosis, plan, or surgery, call the office and ask for your surgeon's nurse.  The anatomy & physiology of the abdominal wall and pelvic floor was discussed. The pathophysiology of hernias in the inguinal and pelvic region was discussed. Natural history risks such as progressive enlargement, pain, incarceration, and strangulation was discussed. Contributors to complications such as smoking, obesity, diabetes, prior surgery, etc were discussed.  I feel the risks of no intervention will lead to serious problems that outweigh the operative risks; therefore, I recommended surgery to reduce and repair the hernia. I explained laparoscopic techniques with possible need for an open approach. I noted usual use of mesh to patch and/or buttress hernia repair  Risks such as bleeding, infection, abscess, need for further treatment, heart attack, death, and other risks were discussed. I noted a good likelihood this will help address the problem. Goals of post-operative recovery were discussed as well. Possibility that this will not correct all symptoms was explained. I stressed the importance of low-impact activity, aggressive pain control, avoiding constipation, & not pushing through pain to minimize risk of post-operative chronic pain or injury. Possibility of reherniation was discussed. We will work to minimize complications.  An educational handout further explaining the pathology & treatment options was given as well. Questions were answered. The patient expresses understanding & wishes to proceed  with surgery.  Pt Education - Pamphlet Given - Hernia Surgery: discussed with patient and provided information. Pt Education - CCS Mesh education: discussed with patient and provided information.

## 2020-03-11 ENCOUNTER — Other Ambulatory Visit: Payer: Self-pay | Admitting: Surgery

## 2020-03-11 DIAGNOSIS — K439 Ventral hernia without obstruction or gangrene: Secondary | ICD-10-CM

## 2020-03-31 ENCOUNTER — Encounter (HOSPITAL_COMMUNITY): Payer: Self-pay

## 2020-03-31 ENCOUNTER — Inpatient Hospital Stay (HOSPITAL_COMMUNITY): Payer: 59 | Attending: Hematology

## 2020-03-31 ENCOUNTER — Other Ambulatory Visit: Payer: Self-pay

## 2020-03-31 DIAGNOSIS — C8290 Follicular lymphoma, unspecified, unspecified site: Secondary | ICD-10-CM | POA: Diagnosis not present

## 2020-03-31 DIAGNOSIS — Z7982 Long term (current) use of aspirin: Secondary | ICD-10-CM | POA: Insufficient documentation

## 2020-03-31 DIAGNOSIS — Z7984 Long term (current) use of oral hypoglycemic drugs: Secondary | ICD-10-CM | POA: Diagnosis not present

## 2020-03-31 DIAGNOSIS — Z833 Family history of diabetes mellitus: Secondary | ICD-10-CM | POA: Diagnosis not present

## 2020-03-31 DIAGNOSIS — Z8249 Family history of ischemic heart disease and other diseases of the circulatory system: Secondary | ICD-10-CM | POA: Insufficient documentation

## 2020-03-31 LAB — COMPREHENSIVE METABOLIC PANEL
ALT: 18 U/L (ref 0–44)
AST: 13 U/L — ABNORMAL LOW (ref 15–41)
Albumin: 3.9 g/dL (ref 3.5–5.0)
Alkaline Phosphatase: 98 U/L (ref 38–126)
Anion gap: 8 (ref 5–15)
BUN: 14 mg/dL (ref 6–20)
CO2: 25 mmol/L (ref 22–32)
Calcium: 8.4 mg/dL — ABNORMAL LOW (ref 8.9–10.3)
Chloride: 104 mmol/L (ref 98–111)
Creatinine, Ser: 0.79 mg/dL (ref 0.61–1.24)
GFR, Estimated: >60 mL/min (ref >60)
Glucose, Bld: 272 mg/dL — ABNORMAL HIGH (ref 70–99)
Potassium: 3.8 mmol/L (ref 3.5–5.1)
Sodium: 137 mmol/L (ref 135–145)
Total Bilirubin: 0.4 mg/dL (ref 0.3–1.2)
Total Protein: 6.6 g/dL (ref 6.5–8.1)

## 2020-03-31 LAB — CBC WITH DIFFERENTIAL/PLATELET
Abs Immature Granulocytes: 0.02 10*3/uL (ref 0.00–0.07)
Basophils Absolute: 0.1 10*3/uL (ref 0.0–0.1)
Basophils Relative: 1 %
Eosinophils Absolute: 0.3 10*3/uL (ref 0.0–0.5)
Eosinophils Relative: 4 %
HCT: 45.2 % (ref 39.0–52.0)
Hemoglobin: 15.2 g/dL (ref 13.0–17.0)
Immature Granulocytes: 0 %
Lymphocytes Relative: 23 %
Lymphs Abs: 1.7 10*3/uL (ref 0.7–4.0)
MCH: 30.4 pg (ref 26.0–34.0)
MCHC: 33.6 g/dL (ref 30.0–36.0)
MCV: 90.4 fL (ref 80.0–100.0)
Monocytes Absolute: 0.5 10*3/uL (ref 0.1–1.0)
Monocytes Relative: 7 %
Neutro Abs: 4.8 10*3/uL (ref 1.7–7.7)
Neutrophils Relative %: 65 %
Platelets: 190 10*3/uL (ref 150–400)
RBC: 5 MIL/uL (ref 4.22–5.81)
RDW: 12 % (ref 11.5–15.5)
WBC: 7.4 10*3/uL (ref 4.0–10.5)
nRBC: 0 % (ref 0.0–0.2)

## 2020-03-31 LAB — LACTATE DEHYDROGENASE: LDH: 97 U/L — ABNORMAL LOW (ref 98–192)

## 2020-04-07 ENCOUNTER — Inpatient Hospital Stay (HOSPITAL_BASED_OUTPATIENT_CLINIC_OR_DEPARTMENT_OTHER): Payer: 59 | Admitting: Hematology

## 2020-04-07 ENCOUNTER — Other Ambulatory Visit: Payer: Self-pay

## 2020-04-07 VITALS — BP 184/125 | HR 92 | Temp 97.2°F | Resp 18 | Wt 212.4 lb

## 2020-04-07 DIAGNOSIS — C8258 Diffuse follicle center lymphoma, lymph nodes of multiple sites: Secondary | ICD-10-CM

## 2020-04-07 DIAGNOSIS — C8290 Follicular lymphoma, unspecified, unspecified site: Secondary | ICD-10-CM | POA: Diagnosis not present

## 2020-04-07 NOTE — Patient Instructions (Addendum)
[place] at [facility] Discharge Instructions  You were seen today by Dr. Delton Coombes. He went over your recent results. Continue monitoring for symptoms, including drenching night sweats, cyclical fevers and weight loss. Your next appointment will be with the physician assistant in 1 year for labs and follow up.   Thank you for choosing [place] at [facility] to provide your oncology and hematology care.  To afford each patient quality time with our provider, please arrive at least 15 minutes before your scheduled appointment time.   If you have a lab appointment with the [facility] please come in thru the Main Entrance and check in at the main information desk  You need to re-schedule your appointment should you arrive 10 or more minutes late.  We strive to give you quality time with our providers, and arriving late affects you and other patients whose appointments are after yours.  Also, if you no show three or more times for appointments you may be dismissed from the clinic at the providers discretion.     Again, thank you for choosing [facility].  Our hope is that these requests will decrease the amount of time that you wait before being seen by our physicians.       _____________________________________________________________  Should you have questions after your visit to [facility], please contact our office at [phone] between the hours of 8:00 a.m. and 4:30 p.m.  Voicemails left after 4:00 p.m. will not be returned until the following business day.  For prescription refill requests, have your pharmacy contact our office and allow 72 hours.    Cancer Center Support Programs:   > Cancer Support Group  2nd Tuesday of the month 1pm-2pm, Journey Room

## 2020-04-07 NOTE — Progress Notes (Signed)
[place],  [id number]   CLINIC:  Medical Oncology/Hematology  PCP:  Rosine Door Love / [place] [address]  [phone]  REASON FOR VISIT:  Follow-up for follicular lymphoma  PRIOR THERAPY: None  CURRENT THERAPY: Surveillance  INTERVAL HISTORY:  Mr. Kevin Good, a 55 y.o. male, returns for routine follow-up for his follicular lymphoma. Olan[place]aju was last seen on 04/02/2019.  Today he reports feeling well. He notes that he started having several episodes of moderate night sweats since beginning of January 2022, without having to change his shirt, and denies having F/C, evening fevers, or weight loss. He notes having pain around his hernia, but denies other abdominal pain. The hernia reduces when reclines.  He will have a surgery with Dr. Brantley Stage to have his hernia repaired. He is working and tolerating it well.   REVIEW OF SYSTEMS:  Review of Systems  Constitutional: Positive for diaphoresis (several moderate episodes of night sweat). Negative for appetite change, chills, fatigue, fever and unexpected weight change.  Gastrointestinal: Positive for abdominal pain (around hernia).  All other systems reviewed and are negative.   PAST MEDICAL/SURGICAL HISTORY:  Past Medical History:  Diagnosis Date  . Non-Hodgkin lymphoma ([place]) 12/08/2015   No past surgical history on file.  SOCIAL HISTORY:  Social History   Socioeconomic History  . Marital status: Married    Spouse name: Not on file  . Number of children: Not on file  . Years of education: Not on file  . Highest education level: Not on file  Occupational History  . Not on file  Tobacco Use  . Smoking status: Never Smoker  . Smokeless tobacco: Never Used  Substance and Sexual Activity  . Alcohol use: No  . Drug use: No  . Sexual activity: Not on file  Other Topics Concern  . Not on file  Social History Narrative  . Not on file   Social  Determinants of Health   Financial Resource Strain: Not on file  Food Insecurity: Not on file  Transportation Needs: Not on file  Physical Activity: Not on file  Stress: Not on file  Social Connections: Not on file  Intimate Partner Violence: Not on file    FAMILY HISTORY:  Family History  Problem Relation Age of Onset  . Diabetes Mother   . Heart disease Father     CURRENT MEDICATIONS:  Current Outpatient Medications  Medication Sig Dispense Refill  . amLODipine (NORVASC) 10 MG tablet     . aspirin 81 MG chewable tablet Chew by mouth.    . cloNIDine (CATAPRES) 0.1 MG tablet     . lisinopril-hydrochlorothiazide (PRINZIDE,ZESTORETIC) 20-12.5 MG tablet     . metFORMIN (GLUCOPHAGE-XR) 500 MG 24 hr tablet     . nabumetone (RELAFEN) 750 MG tablet Take 750 mg by mouth 2 (two) times daily.    Marland Kitchen PROMETHEGAN 25 MG suppository SMARTSIG:1 SUPPOS Rectally Every 8 Hours PRN (Patient not taking: Reported on 04/07/2020)     No current facility-administered medications for this visit.    ALLERGIES:  Not on File  PHYSICAL EXAM:  Performance status (ECOG): 0 - Asymptomatic  Vitals:   04/07/20 0827  BP: (!) 184/125  Pulse: 92  Resp: 18  Temp: (!) 97.2 F (36.2 C)  SpO2: 96%   Wt Readings from Last 3 Encounters:  04/07/20 212 lb 6.4 oz (96.3 kg)  04/02/19 193 lb (87.5 kg)  03/13/18 215 lb (97.5  kg)   Physical Exam Vitals reviewed.  Constitutional:      Appearance: Normal appearance.  Cardiovascular:     Rate and Rhythm: Normal rate and regular rhythm.     Pulses: Normal pulses.     Heart sounds: Normal heart sounds.  Pulmonary:     Effort: Pulmonary effort is normal.     Breath sounds: Normal breath sounds.  Chest:  Breasts:     Right: No axillary adenopathy or supraclavicular adenopathy.     Left: No axillary adenopathy or supraclavicular adenopathy.    Abdominal:     Palpations: Abdomen is soft. There is no splenomegaly or mass.     Tenderness: There is no  abdominal tenderness.     Hernia: A hernia is present. Hernia is present in the ventral area.  Musculoskeletal:     Right lower leg: No edema.     Left lower leg: No edema.  Lymphadenopathy:     Cervical: No cervical adenopathy.     Upper Body:     Right upper body: No supraclavicular, axillary or pectoral adenopathy.     Left upper body: No supraclavicular, axillary or pectoral adenopathy.  Neurological:     General: No focal deficit present.     Mental Status: He is alert and oriented to person, place, and time.  Psychiatric:        Mood and Affect: Mood normal.        Behavior: Behavior normal.     LABORATORY DATA:  I have reviewed the labs as listed.  CBC Latest Ref Rng & Units 03/31/2020 03/12/2019 03/06/2018  WBC 4.0 - 10.5 K/uL 7.4 5.7 5.9  Hemoglobin 13.0 - 17.0 g/dL 15.2 14.5 14.8  Hematocrit 39.0 - 52.0 % 45.2 43.2 43.6  Platelets 150 - 400 K/uL 190 168 177   CMP Latest Ref Rng & Units 03/31/2020 03/12/2019 03/06/2018  Glucose 70 - 99 mg/dL 272(H) 438(H) 290(H)  BUN 6 - 20 mg/dL 14 17 14   Creatinine 0.61 - 1.24 mg/dL 0.79 0.88 0.89  Sodium 135 - 145 mmol/L 137 135 136  Potassium 3.5 - 5.1 mmol/L 3.8 4.3 3.5  Chloride 98 - 111 mmol/L 104 103 101  CO2 22 - 32 mmol/L 25 26 27   Calcium 8.9 - 10.3 mg/dL 8.4(L) 8.7(L) 8.8(L)  Total Protein 6.5 - 8.1 g/dL 6.6 6.3(L) 6.9  Total Bilirubin 0.3 - 1.2 mg/dL 0.4 0.6 0.7  Alkaline Phos 38 - 126 U/L 98 114 92  AST 15 - 41 U/L 13(L) 12(L) 18  ALT 0 - 44 U/L 18 21 34      Component Value Date/Time   RBC 5.00 03/31/2020 0946   MCV 90.4 03/31/2020 0946   MCH 30.4 03/31/2020 0946   MCHC 33.6 03/31/2020 0946   RDW 12.0 03/31/2020 0946   LYMPHSABS 1.7 03/31/2020 0946   MONOABS 0.5 03/31/2020 0946   EOSABS 0.3 03/31/2020 0946   BASOSABS 0.1 03/31/2020 0946   Lab Results  Component Value Date   LDH 97 (L) 03/31/2020   LDH 92 (L) 03/12/2019   LDH 100 03/06/2018    DIAGNOSTIC IMAGING:  I have independently reviewed the scans and  discussed with the patient. No results found.   ASSESSMENT:  1.  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 -Status post biopsy on 08/05/3149 showing follicular lymphoma.  Bone marrow aspiration biopsy in May 2017 was negative. -Last CT AP on 01/26/2016  shows calcified nodal mass measuring 3.5 x 2.1 cm and the more cephalad mesenteric node measuring 10 mm.  Bilateral external iliac lymphadenopathy.    PLAN:  1.  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  He reports mild night sweats in the last couple of months.  They are not drenching type. -Examination did not reveal any palpable masses or splenomegaly. -Reviewed labs from 03/31/2020.  LDH was normal.  LFTs and CBC were also normal. -He is having ventral hernia repair surgery with Dr. Brantley Stage. -RTC 1 year for follow-up.  Orders placed this encounter:  Orders Placed This Encounter  Procedures  . CBC with Differential/Platelet  . Comprehensive metabolic panel  . Lactate dehydrogenase     Derek Jack, MD [place] [phone]   I, Milinda Antis, am acting as a scribe for Dr. Sanda Linger.  I, Derek Jack MD, have reviewed the above documentation for accuracy and completeness, and I agree with the above.

## 2021-04-10 ENCOUNTER — Ambulatory Visit: Payer: BC Managed Care – PPO

## 2021-04-10 ENCOUNTER — Other Ambulatory Visit: Payer: Self-pay

## 2021-04-10 ENCOUNTER — Encounter: Payer: Self-pay | Admitting: Orthopedic Surgery

## 2021-04-10 ENCOUNTER — Ambulatory Visit (INDEPENDENT_AMBULATORY_CARE_PROVIDER_SITE_OTHER): Payer: BC Managed Care – PPO | Admitting: Orthopedic Surgery

## 2021-04-10 VITALS — BP 165/104 | HR 83 | Ht 70.0 in | Wt 203.0 lb

## 2021-04-10 DIAGNOSIS — M170 Bilateral primary osteoarthritis of knee: Secondary | ICD-10-CM

## 2021-04-10 DIAGNOSIS — M1711 Unilateral primary osteoarthritis, right knee: Secondary | ICD-10-CM

## 2021-04-10 DIAGNOSIS — M25562 Pain in left knee: Secondary | ICD-10-CM

## 2021-04-10 DIAGNOSIS — M1712 Unilateral primary osteoarthritis, left knee: Secondary | ICD-10-CM

## 2021-04-10 NOTE — Progress Notes (Signed)
New Patient Visit ? ?Assessment: ?Kevin Good is a 56 y.o. male with the following: ?1. Arthritis of left knee ?2. Arthritis of right knee ? ?Plan: ?Kevin Good has arthritis in bilateral knees.  We reviewed the radiographs in clinic today, and I outlined the natural progression.  We had an extensive discussion regarding all potential treatment options, including continuing with the current treatment. NSAIDs are the most appropriate medications, and these are available OTC or via prescription.  I have urged them to remain active, and they can continue with activities on their own, or we can refer them to physical therapy.  We can also consider a brace, or compression sleeve. If the pain is severe enough, we can consider a steroid injection.  If their knee pain is affecting their everyday activities, including sleep, knee replacement is a consideration, but we would have to refer them to see my partner Dr. Aline Brochure. ? ?After discussing all of these options, the patient has elected to proceed with medications as needed and bilateral knee braces.   ? ?Follow-up: ?Return if symptoms worsen or fail to improve. ? ?Subjective: ? ?Chief Complaint  ?Patient presents with  ? Knee Pain  ?  Bilateral knee pain  ? New Patient (Initial Visit)  ? ? ?History of Present Illness: ?Kevin Good is a 56 y.o. male who has been referred to clinic today by Clemmie Krill, PA-C for evaluation of bilateral knee pain.  He has progressively worsening bilateral knee pain for the past 5 years.  No specific injury.  Left is worse than right.  He states he takes ibuprofen maybe twice a week.  He has not noticed any swelling.  He has not worked with physical therapy.  No prior injections.  He has not tried a brace. ? ? ?Review of Systems: ?No fevers or chills ?No numbness or tingling ?No chest pain ?No shortness of breath ?No bowel or bladder dysfunction ?No GI distress ?No headaches ? ? ?Medical History: ? ?Past Medical History:   ?Diagnosis Date  ? Non-Hodgkin lymphoma ([place]) 12/08/2015  ? ? ?No past surgical history on file. ? ?Family History  ?Problem Relation Age of Onset  ? Diabetes Mother   ? Heart disease Father   ? ?Social History  ? ?Tobacco Use  ? Smoking status: Never  ? Smokeless tobacco: Never  ?Substance Use Topics  ? Alcohol use: No  ? Drug use: No  ? ? ?No Known Allergies ? ?Current Meds  ?Medication Sig  ? amLODipine (NORVASC) 10 MG tablet   ? aspirin 81 MG chewable tablet Chew by mouth.  ? cloNIDine (CATAPRES) 0.1 MG tablet   ? lisinopril-hydrochlorothiazide (PRINZIDE,ZESTORETIC) 20-12.5 MG tablet   ? metFORMIN (GLUCOPHAGE-XR) 500 MG 24 hr tablet   ? nabumetone (RELAFEN) 750 MG tablet Take 750 mg by mouth 2 (two) times daily.  ? ? ?Objective: ?BP (!) 165/104   Pulse 83   Ht 5\' 10"  (1.778 m)   Wt 203 lb (92.1 kg)   BMI 29.13 kg/m?  ? ?Physical Exam: ? ?General: Alert and oriented. and No acute distress. ?Gait: Slow, steady gait. ? ?Evaluation of bilateral knees demonstrates no effusion.  On the left, there is a varus deformity.  Mild varus deformity on the right.  Range of motion from 10-120 on the left.  Range of motion on the right from 3-120.  Varus deformity is not correctable.  Negative Lachman bilaterally.  Tenderness to palpation over the medial knee. ? ?IMAGING: ?I personally ordered  and reviewed the following images ? ?X-rays of the left knee were obtained in clinic today.  There is an AP view of bilateral knees.  He has a varus alignment on the left, more noticeable than the right.  Complete loss of joint space within the medial compartment bilaterally.  Left is progressively worse than the right.  There are underlying cysts.  Within the patellofemoral compartment, there is loss of joint space, with osteophytes present. ? ?Impression: Severe left knee arthritis, arthritis noted within the right knee on a single AP view. ? ? ?New Medications:  ?No orders of the defined types were placed in this  encounter. ? ? ? ? ?Mordecai Rasmussen, MD ? ?04/10/2021 ?8:45 PM ? ? ?

## 2021-04-10 NOTE — Patient Instructions (Signed)

## 2021-04-13 ENCOUNTER — Inpatient Hospital Stay (HOSPITAL_COMMUNITY): Payer: BC Managed Care – PPO

## 2021-04-17 ENCOUNTER — Other Ambulatory Visit: Payer: Self-pay | Admitting: *Deleted

## 2021-04-17 DIAGNOSIS — C8258 Diffuse follicle center lymphoma, lymph nodes of multiple sites: Secondary | ICD-10-CM

## 2021-04-20 ENCOUNTER — Ambulatory Visit (HOSPITAL_COMMUNITY): Payer: 59 | Admitting: Hematology

## 2022-01-05 ENCOUNTER — Encounter (INDEPENDENT_AMBULATORY_CARE_PROVIDER_SITE_OTHER): Payer: Self-pay | Admitting: *Deleted

## 2022-06-15 ENCOUNTER — Encounter (INDEPENDENT_AMBULATORY_CARE_PROVIDER_SITE_OTHER): Payer: Self-pay | Admitting: *Deleted

## 2023-12-29 ENCOUNTER — Encounter: Payer: Self-pay | Admitting: Internal Medicine
